# Patient Record
Sex: Male | Born: 1935 | Race: White | Hispanic: No | Smoking: Never smoker
Health system: Southern US, Community
[De-identification: ages and names within clinical notes are randomized; demographics above are authoritative.]

## PROBLEM LIST (undated history)

## (undated) DIAGNOSIS — D494 Neoplasm of unspecified behavior of bladder: Secondary | ICD-10-CM

## (undated) DIAGNOSIS — N400 Enlarged prostate without lower urinary tract symptoms: Secondary | ICD-10-CM

## (undated) DIAGNOSIS — F329 Major depressive disorder, single episode, unspecified: Secondary | ICD-10-CM

## (undated) DIAGNOSIS — F32A Depression, unspecified: Secondary | ICD-10-CM

## (undated) DIAGNOSIS — C801 Malignant (primary) neoplasm, unspecified: Secondary | ICD-10-CM

## (undated) HISTORY — PX: HERNIA REPAIR: SHX51

## (undated) HISTORY — DX: Neoplasm of unspecified behavior of bladder: D49.4

## (undated) HISTORY — DX: Malignant (primary) neoplasm, unspecified: C80.1

## (undated) HISTORY — PX: OTHER SURGICAL HISTORY: SHX169

## (undated) HISTORY — PX: COLON SURGERY: SHX602

## (undated) HISTORY — DX: Major depressive disorder, single episode, unspecified: F32.9

## (undated) HISTORY — DX: Benign prostatic hyperplasia without lower urinary tract symptoms: N40.0

## (undated) HISTORY — DX: Depression, unspecified: F32.A

---

## 2003-01-03 ENCOUNTER — Ambulatory Visit (HOSPITAL_BASED_OUTPATIENT_CLINIC_OR_DEPARTMENT_OTHER): Admission: RE | Admit: 2003-01-03 | Discharge: 2003-01-03 | Payer: Self-pay | Admitting: Urology

## 2003-01-03 ENCOUNTER — Encounter (INDEPENDENT_AMBULATORY_CARE_PROVIDER_SITE_OTHER): Payer: Self-pay

## 2003-01-03 ENCOUNTER — Ambulatory Visit (HOSPITAL_COMMUNITY): Admission: RE | Admit: 2003-01-03 | Discharge: 2003-01-03 | Payer: Self-pay | Admitting: Urology

## 2003-01-03 ENCOUNTER — Encounter: Payer: Self-pay | Admitting: Urology

## 2003-01-07 ENCOUNTER — Emergency Department (HOSPITAL_COMMUNITY): Admission: EM | Admit: 2003-01-07 | Discharge: 2003-01-07 | Payer: Self-pay | Admitting: Emergency Medicine

## 2003-01-24 ENCOUNTER — Ambulatory Visit (HOSPITAL_COMMUNITY): Admission: RE | Admit: 2003-01-24 | Discharge: 2003-01-24 | Payer: Self-pay | Admitting: Vascular Surgery

## 2003-02-23 ENCOUNTER — Inpatient Hospital Stay (HOSPITAL_COMMUNITY): Admission: AD | Admit: 2003-02-23 | Discharge: 2003-02-25 | Payer: Self-pay | Admitting: Cardiology

## 2003-03-12 ENCOUNTER — Emergency Department (HOSPITAL_COMMUNITY): Admission: EM | Admit: 2003-03-12 | Discharge: 2003-03-12 | Payer: Self-pay | Admitting: Emergency Medicine

## 2003-03-14 ENCOUNTER — Emergency Department (HOSPITAL_COMMUNITY): Admission: EM | Admit: 2003-03-14 | Discharge: 2003-03-15 | Payer: Self-pay | Admitting: Emergency Medicine

## 2003-03-17 ENCOUNTER — Ambulatory Visit (HOSPITAL_COMMUNITY): Admission: RE | Admit: 2003-03-17 | Discharge: 2003-03-17 | Payer: Self-pay | Admitting: Family Medicine

## 2003-03-18 ENCOUNTER — Inpatient Hospital Stay (HOSPITAL_COMMUNITY): Admission: EM | Admit: 2003-03-18 | Discharge: 2003-03-20 | Payer: Self-pay | Admitting: Emergency Medicine

## 2003-03-31 ENCOUNTER — Emergency Department (HOSPITAL_COMMUNITY): Admission: EM | Admit: 2003-03-31 | Discharge: 2003-03-31 | Payer: Self-pay

## 2003-04-04 ENCOUNTER — Encounter (INDEPENDENT_AMBULATORY_CARE_PROVIDER_SITE_OTHER): Payer: Self-pay | Admitting: *Deleted

## 2003-04-04 ENCOUNTER — Inpatient Hospital Stay (HOSPITAL_COMMUNITY): Admission: RE | Admit: 2003-04-04 | Discharge: 2003-04-12 | Payer: Self-pay | Admitting: *Deleted

## 2003-04-29 ENCOUNTER — Emergency Department (HOSPITAL_COMMUNITY): Admission: EM | Admit: 2003-04-29 | Discharge: 2003-04-29 | Payer: Self-pay | Admitting: Emergency Medicine

## 2003-06-16 HISTORY — PX: TRANSURETHRAL RESECTION OF PROSTATE: SHX73

## 2003-06-23 ENCOUNTER — Inpatient Hospital Stay (HOSPITAL_COMMUNITY): Admission: RE | Admit: 2003-06-23 | Discharge: 2003-06-26 | Payer: Self-pay | Admitting: Urology

## 2003-06-23 ENCOUNTER — Encounter (INDEPENDENT_AMBULATORY_CARE_PROVIDER_SITE_OTHER): Payer: Self-pay | Admitting: Specialist

## 2003-07-08 ENCOUNTER — Emergency Department (HOSPITAL_COMMUNITY): Admission: EM | Admit: 2003-07-08 | Discharge: 2003-07-08 | Payer: Self-pay | Admitting: Emergency Medicine

## 2005-11-07 ENCOUNTER — Ambulatory Visit (HOSPITAL_COMMUNITY): Admission: RE | Admit: 2005-11-07 | Discharge: 2005-11-07 | Payer: Self-pay | Admitting: Family Medicine

## 2005-11-08 ENCOUNTER — Emergency Department (HOSPITAL_COMMUNITY): Admission: EM | Admit: 2005-11-08 | Discharge: 2005-11-08 | Payer: Self-pay | Admitting: Emergency Medicine

## 2005-12-17 ENCOUNTER — Ambulatory Visit (HOSPITAL_COMMUNITY): Admission: RE | Admit: 2005-12-17 | Discharge: 2005-12-17 | Payer: Self-pay

## 2005-12-17 ENCOUNTER — Encounter (INDEPENDENT_AMBULATORY_CARE_PROVIDER_SITE_OTHER): Payer: Self-pay | Admitting: Specialist

## 2009-02-02 ENCOUNTER — Encounter: Admission: RE | Admit: 2009-02-02 | Discharge: 2009-02-02 | Payer: Self-pay | Admitting: Gastroenterology

## 2009-02-16 ENCOUNTER — Encounter: Admission: RE | Admit: 2009-02-16 | Discharge: 2009-02-16 | Payer: Self-pay | Admitting: Family Medicine

## 2009-03-13 ENCOUNTER — Encounter (INDEPENDENT_AMBULATORY_CARE_PROVIDER_SITE_OTHER): Payer: Self-pay | Admitting: General Surgery

## 2009-03-13 ENCOUNTER — Inpatient Hospital Stay (HOSPITAL_COMMUNITY): Admission: RE | Admit: 2009-03-13 | Discharge: 2009-03-19 | Payer: Self-pay | Admitting: General Surgery

## 2009-03-29 ENCOUNTER — Ambulatory Visit: Payer: Self-pay | Admitting: Oncology

## 2009-04-09 LAB — CBC WITH DIFFERENTIAL/PLATELET
BASO%: 0.4 % (ref 0.0–2.0)
EOS%: 2.6 % (ref 0.0–7.0)
LYMPH%: 43.6 % (ref 14.0–49.0)
MCH: 27.4 pg (ref 27.2–33.4)
MCHC: 32.2 g/dL (ref 32.0–36.0)
MONO#: 0.8 10*3/uL (ref 0.1–0.9)
MONO%: 9.8 % (ref 0.0–14.0)
NEUT%: 43.6 % (ref 39.0–75.0)
Platelets: 142 10*3/uL (ref 140–400)
RBC: 4.63 10*6/uL (ref 4.20–5.82)
WBC: 7.8 10*3/uL (ref 4.0–10.3)
nRBC: 0 % (ref 0–0)

## 2009-04-09 LAB — COMPREHENSIVE METABOLIC PANEL
ALT: 10 U/L (ref 0–53)
AST: 20 U/L (ref 0–37)
Alkaline Phosphatase: 69 U/L (ref 39–117)
BUN: 12 mg/dL (ref 6–23)
Creatinine, Ser: 1.35 mg/dL (ref 0.40–1.50)
Total Bilirubin: 0.7 mg/dL (ref 0.3–1.2)

## 2009-05-24 ENCOUNTER — Encounter: Admission: RE | Admit: 2009-05-24 | Discharge: 2009-05-24 | Payer: Self-pay | Admitting: General Surgery

## 2010-01-29 ENCOUNTER — Encounter: Admission: RE | Admit: 2010-01-29 | Discharge: 2010-01-29 | Payer: Self-pay | Admitting: Gastroenterology

## 2010-04-07 ENCOUNTER — Encounter: Payer: Self-pay | Admitting: Gastroenterology

## 2010-06-17 LAB — URINALYSIS, ROUTINE W REFLEX MICROSCOPIC
Glucose, UA: NEGATIVE mg/dL
Nitrite: NEGATIVE
Specific Gravity, Urine: 1.014 (ref 1.005–1.030)
pH: 7 (ref 5.0–8.0)

## 2010-06-17 LAB — DIFFERENTIAL
Basophils Absolute: 0 10*3/uL (ref 0.0–0.1)
Basophils Relative: 0 % (ref 0–1)
Eosinophils Relative: 2 % (ref 0–5)
Lymphocytes Relative: 29 % (ref 12–46)
Monocytes Absolute: 0.9 10*3/uL (ref 0.1–1.0)
Monocytes Relative: 12 % (ref 3–12)
Neutro Abs: 4.5 10*3/uL (ref 1.7–7.7)

## 2010-06-17 LAB — CBC
HCT: 37.6 % — ABNORMAL LOW (ref 39.0–52.0)
HCT: 39.1 % (ref 39.0–52.0)
Hemoglobin: 12.3 g/dL — ABNORMAL LOW (ref 13.0–17.0)
Hemoglobin: 12.9 g/dL — ABNORMAL LOW (ref 13.0–17.0)
MCHC: 32.9 g/dL (ref 30.0–36.0)
MCV: 85.5 fL (ref 78.0–100.0)
RBC: 4.4 MIL/uL (ref 4.22–5.81)
RBC: 4.53 MIL/uL (ref 4.22–5.81)
RDW: 14.2 % (ref 11.5–15.5)
WBC: 10.2 10*3/uL (ref 4.0–10.5)

## 2010-06-17 LAB — BASIC METABOLIC PANEL
CO2: 25 mEq/L (ref 19–32)
Calcium: 9.2 mg/dL (ref 8.4–10.5)
Chloride: 107 mEq/L (ref 96–112)
GFR calc Af Amer: >60 mL/min (ref >60)
GFR calc non Af Amer: >60 mL/min (ref >60)
Glucose, Bld: 93 mg/dL (ref 70–99)
Potassium: 4.6 mEq/L (ref 3.5–5.1)
Potassium: 4.8 mEq/L (ref 3.5–5.1)
Sodium: 136 mEq/L (ref 135–145)
Sodium: 137 mEq/L (ref 135–145)

## 2010-06-17 LAB — APTT: aPTT: 30 seconds (ref 24–37)

## 2010-07-06 IMAGING — CR DG CHEST 2V
2 series · 2 of 2 positions shown · non-contrast
Comparison: Chest x-ray of 12/15/2005

CLINICAL DATA: Cough, preop for colon lesion

CHEST - 2 VIEW

[view not recorded (1 of 2)]
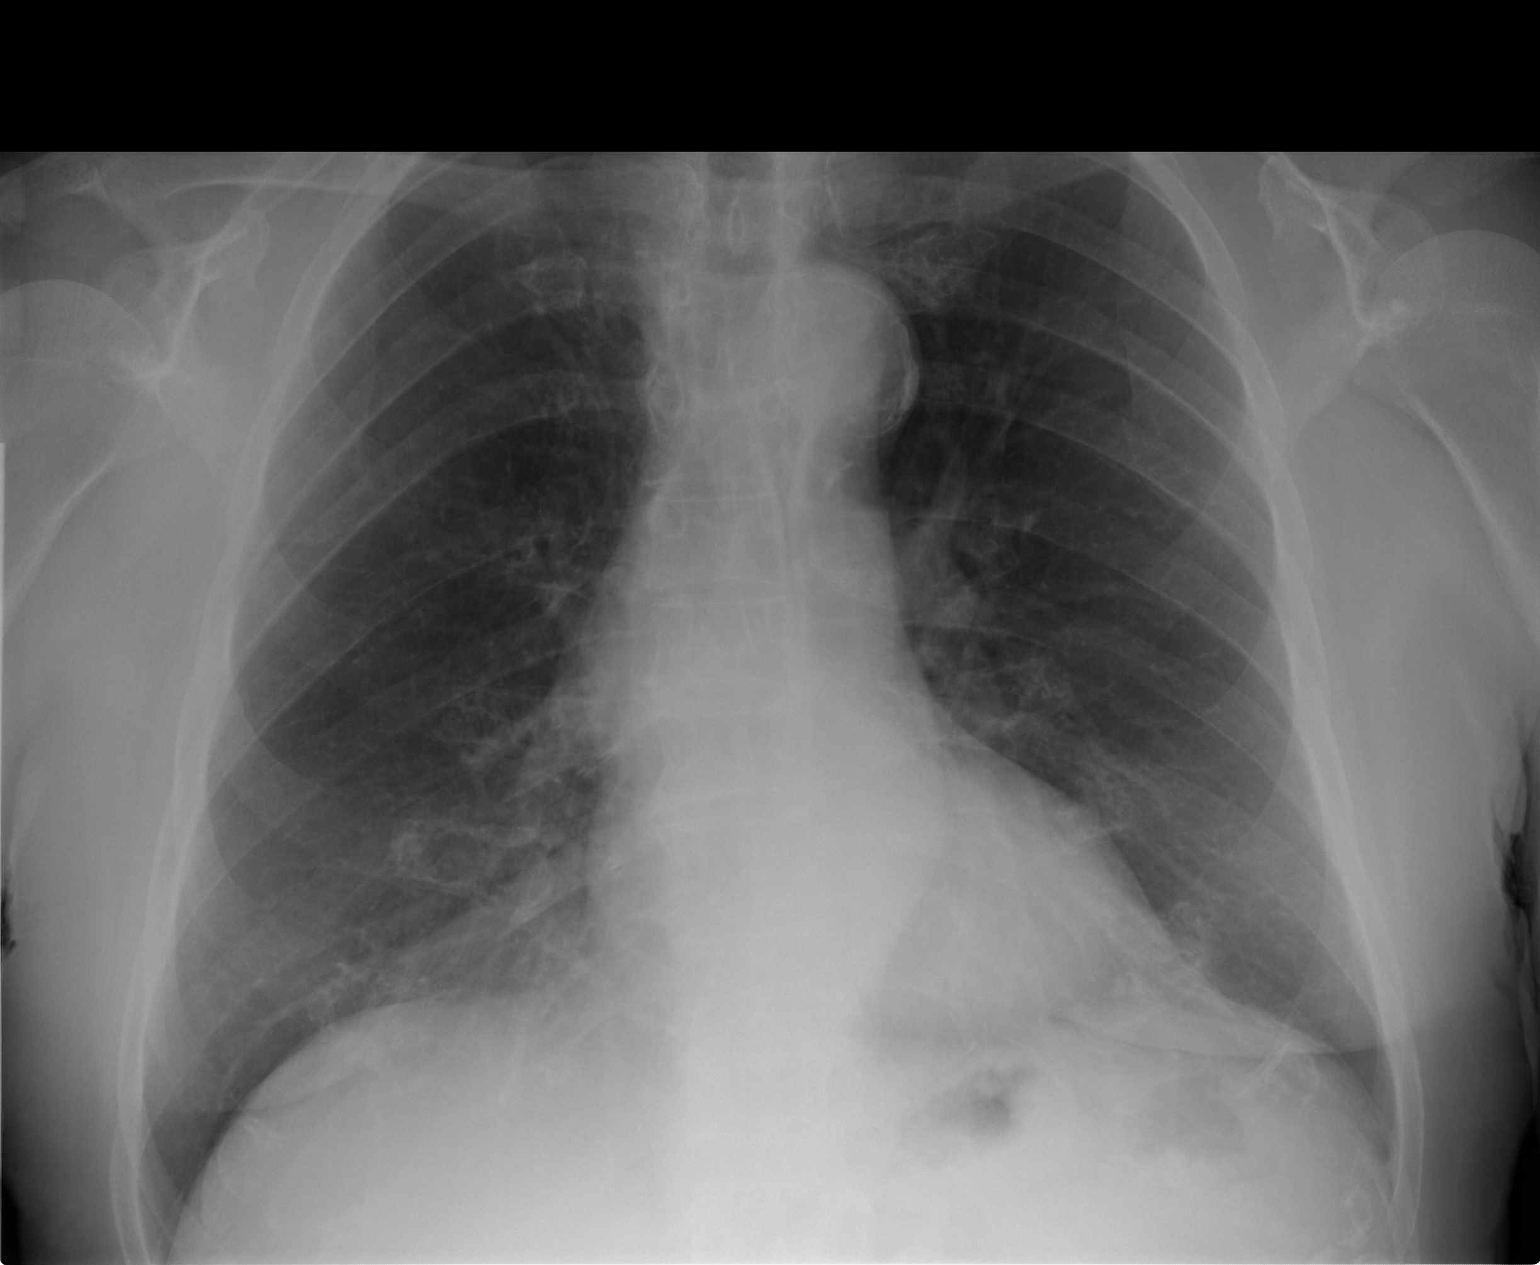

[view not recorded (2 of 2)]
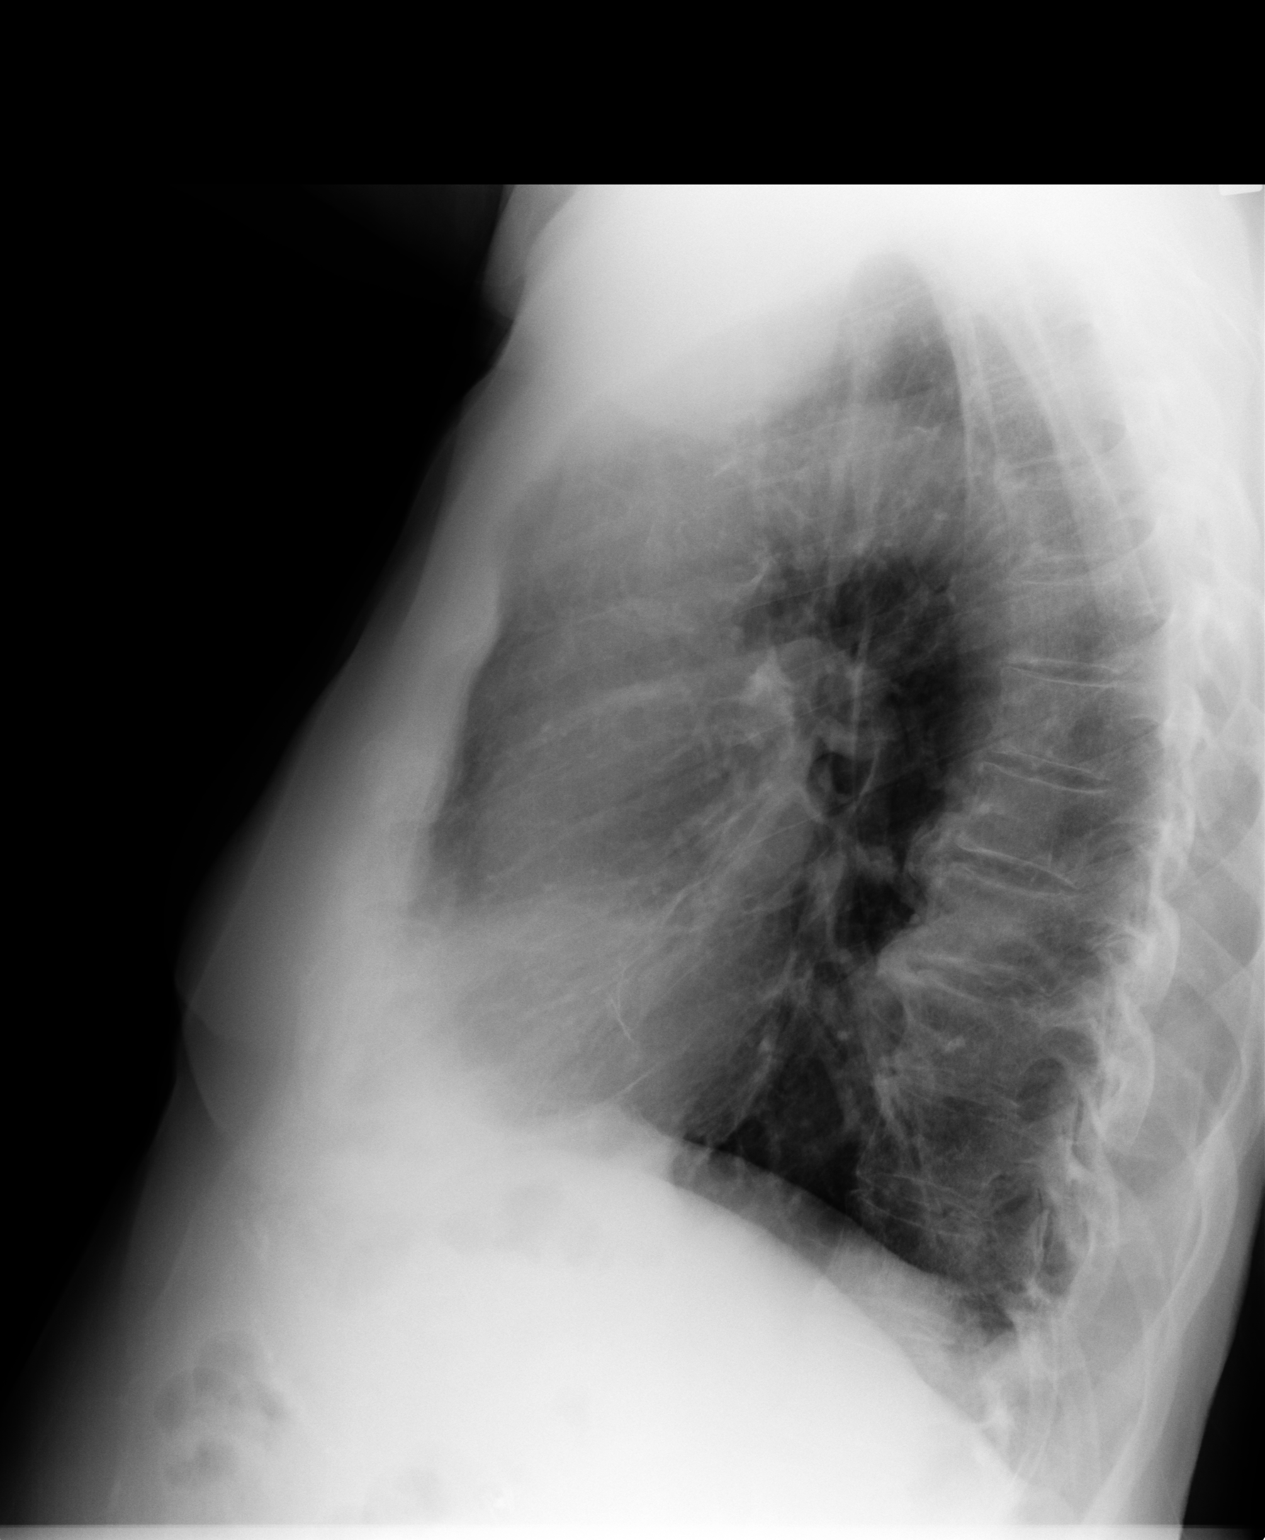

[2 of 2 positions shown; findings below may reference images not displayed]

FINDINGS: The lungs are clear and slightly hyperaerated.  The heart
is within normal limits in size.  Mediastinal contours are stable.
There are degenerative changes in the lower thoracic spine.
IMPRESSION: Stable chest x-ray.  No active lung disease.

## 2010-08-02 NOTE — H&P (Signed)
NAME:  EDWAR, COE NO.:  [id number]   MEDICAL RECORD NO.:  [id number]                   PATIENT TYPE:  EMS   LOCATION:  ED                                   FACILITY:  [facility]   PHYSICIAN:  Balinda Quails, M.D.                 DATE OF BIRTH:  04-11-1916   DATE OF ADMISSION:  03/31/2003  DATE OF DISCHARGE:                                HISTORY & PHYSICAL   CHIEF COMPLAINT:  Bilateral iliac aneurysms.   HISTORY OF PRESENT ILLNESS:  Ricky Nguyen is a very-pleasant 77 year old  gentleman with a history of bladder tumor who recently underwent a  transurethral resection of his bladder tumor and was found to have bilateral  iliac aneurysms on the preoperative CT scan.  A CT scan from December 19, 2002, showed a 4.5 cm left internal iliac artery aneurysm and bilateral  common iliac artery aneurysms.  The patient was seen in the clinic by Dr.  Madilyn Fireman who felt that the patient needed further evaluation with an  arteriogram for better assessment and decision making.  The patient  underwent an arteriogram on January 24, 2003, which revealed bilateral  internal iliac artery aneurysm, bilateral patent renal arteries and patent  celiac and SMA without flow-limiting stenosis.  The patient also underwent  cardiac catheterization for postoperative screening.  This was completed on  February 23, 2003, and revealed widely patent coronary arteries.  The patient  had a normal ejection fraction of 64%.  The patient then was cleared by Dr.  Amil Amen for vascular surgery.   The plan is for the patient to meet with Dr. Madilyn Fireman in the CVTS office on  Monday April 03, 2003, for discussion of the details of surgery.  The  patient has already been explained the risks, benefits and options of the  surgery by Dr. Madilyn Fireman in a prior office visit.  The plan is for the patient  to undergo surgery on April 04, 2003, for bilateral iliac aneurysm repair.  The patient presents today for  preoperative history and physical.   Currently, the patient's main complaint is that of problems with his  indwelling Foley catheter.  The patient has had this since his surgery for  resection of the bladder tumor secondary to incontinence and  urinary  retention.  Apparently, the patient was seen in Dr. Madilyn Hook office yesterday  at which time he decided to remove the Foley catheter and to see how the  patient fared without it.  Unfortunately, the patient developed urinary  retention throughout the evening and into the night, and presented to the  emergency room last night for evaluation.  A Foley catheter was reinserted  and yielded exorbitant amounts of urine which were blood tinged.  The  patient has continued to have mild bloody discharge from the urethral  opening as well as blood tinged urine collecting in the leg bag.  The  patient states that the pain is much less at this point after taking several  Percocet.  Otherwise, Ricky Nguyen states that he is feeling quite remarkably  well.  Specifically, the patient denies any cough, fever, chills, night  sweats, nausea, vomiting, diarrhea or constipation.  He denies any shortness  of breath, dyspnea on exertion palpitations, chest pain.   PAST MEDICAL HISTORY:  1. Bladder cancer status post transurethral resection of bladder tumor on     January 03, 2003.  2. Incarcerated inguinal hernia repair on the right on March 18, 2003.  3. Chronic Foley catheter secondary to urinary retention and incontinence.     (This has been in place since October of 2004).  4. Status post left brachial sheath hematoma evacuation by Dr. Arbie Cookey on     February 25, 2003.  5. The patient specifically denies any diabetes, hypertension, history of     myocardial infarction, angina, history of stroke or CVA.   CURRENT MEDICATIONS:  1. Prednisone 10 mg daily (last dose on Sunday, April 02, 2003).  2. Ativan 0.5 to 1 mg t.i.d. as needed.  3. Ambien 5-10 mg p.o.  q.h.s. for sleep.  4. Percocet one to two tablets q.6h. as needed for pain.  5. Pyridium as needed for bladder spasms.  6. Docusate sodium, 100 mg p.o. b.i.d.  7. Milk of Magnesia two tablespoons daily.   ALLERGIES:  No known drug allergies.  The patient does have a sensitivity to  morphine which causes severe headache.   SOCIAL HISTORY:  The patient is retired and is married with five children.  He denies any tobacco use although he did smoke cigars of which he quit 21  years ago.  The patient denies any alcohol use recently.  He does admit to a  one glass of wine per day alcohol intake prior to discovery of his bladder  cancer.   FAMILY HISTORY:  The patient specifically denies any family history of heart  disease, diabetes or other cancer.   PERTINENT REVIEW OF SYSTEMS:  Please see HPI for pertinent positives and  negatives.  Otherwise, 12-point review of systems is negative besides the  following:  1. The patient admits to arthritis.  2. The patient admits to occasional headaches which are relieved by over-the-     counter medications.  3. The patient wears glasses.  4. The patient has a history of recurrent sinusitis.   PHYSICAL EXAMINATION:  VITAL SIGNS:  Blood pressure 120/80, pulse 75 and  regular, respirations are 16 and unlabored.  Weight 166 pounds.  Height is 5  feet, 7 inches.  GENERAL:  This is a well-appearing, well-nourished, pleasant 77 year old  male who is in no acute distress.  He answers questions appropriately.  HEENT:  EOMI, conjunctivae clear.  Sclerae is anicteric.  Hearing is within  normal limits.  Nasopharynx is pink and moist without exudate.  The patient  has all of his native teeth and no oral sores.  NECK:  Supple, no lymphadenopathy or thyromegaly.  CHEST:  Lungs are clear to auscultation throughout without wheezes, crackles  or rales.  BACK:  The patient has no CVA or spinal tenderness. HEART/VASCULAR:  The patient's heart is a regular rate and  rhythm without  murmur, gallop or rub.  He has no evidence of carotid bruits.  The patient  has 2+ carotid pulses bilaterally, 2+ radial pulses bilaterally, 2+ femoral  pulses bilaterally, 1+ posterior tibial pulses bilaterally, and nonpalpable  dorsalis pedis pulses.  The  patient has very minimal venous insufficiency  changes in the lower extremities.  She has no peripheral edema and is warm  and well perfused.  ABDOMEN:  Soft, nontender, nondistended with good bowel sounds.  No  organomegaly or masses.  GU:  The patient does have a leg urine bag and  catheter placed in the ureteral opening.  There is blood-tinged urine  collecting in the leg bag.  SKELETAL/ NEUROMUSCULAR:  Appropriate and equal strength throughout.  NEUROLOGIC:  Grossly intact.  Sensation is intact.   IMPRESSION/ PLAN:  1. Ricky Nguyen is an otherwise healthy 77 year old gentleman who has an     incidental finding of bilateral iliac aneurysms.  The patient has been     seen by Dr. Madilyn Fireman to discuss the risks, benefits and complications of     surgery.  The patient has discussed this with his family and agrees to     proceed with surgery as planned.  The patient will undergo surgical     repair of his bilateral iliac aneurysms on April 04, 2003, by Dr.     Madilyn Fireman.  The patient is to see Dr. Madilyn Fireman once more in the office on     April 03, 2003, at which time he will answer any further questions or     concerns that he may have regarding surgery.  At that time, Dr. Madilyn Fireman     will discuss the risks, benefits and complications of the procedure once     again.  2. Otherwise, we will continue as planned with bilateral iliac aneurysm     repair on April 04, 2003.      Carolyn A. Arlean Hopping, M.D.    CAF/MEDQ  D:  03/31/2003  T:  03/31/2003  Job:  [id number]   cc:   Everardo All. Madilyn Fireman, M.D.  [address]  [place]  [place] [address]  Fax: [phone]   Lindaann Slough, M.D.  [address]  [place]  [place] [address]  Fax: [phone]   Francisca December, M.D.  [address]  [address]  [place]  [place] [address]  Fax: [phone]   P. Liliane Bade, M.D.  [address]  [place]  [place] [address]

## 2010-08-02 NOTE — Op Note (Signed)
NAME:  Ricky Nguyen, Ricky Nguyen                       ACCOUNT NO.:  [id number]   MEDICAL RECORD NO.:  [id number]                   PATIENT TYPE:  INP   LOCATION:  2310                                 FACILITY:  [facility]   PHYSICIAN:  Balinda Quails, M.D.                 DATE OF BIRTH:  Apr 15, 1916   DATE OF PROCEDURE:  04/04/2003  DATE OF DISCHARGE:                                 OPERATIVE REPORT   PREOPERATIVE DIAGNOSES:  1. Bilateral common iliac artery aneurysms.  2. Bilateral hypogastric artery aneurysms.   POSTOPERATIVE DIAGNOSES:  1. Bilateral common iliac artery aneurysms.  2. Bilateral hypogastric artery aneurysms.   OPERATION PERFORMED:  1. Repair of bilateral common iliac aneurysms and hypogastric aneurysms with     aortobi-iliac graft.  2. Reimplantation of inferior mesenteric artery.   SURGEON:  Balinda Quails, M.D.   ASSISTANT:  Coral Ceo, P.A.   ANESTHESIA:  General endotracheal.   ANESTHESIOLOGIST:  Quita Skye. Krista Blue, M.D.   INDICATIONS FOR PROCEDURE:  Ricky Nguyen is an 77 year old gentleman with a  history of bladder cancer.  During work-up of bladder cancer, he was found  to have a large common iliac and hypogastric artery aneurysms bilaterally.  He recently presented with an incarcerated hernia that required emergency  surgery.  This delayed his elective procedure for aneurysm repair.   He has undergone cardiac catheterization verifying adequate coronary  circulation.  He underwent preoperative arteriography verifying also large  common iliac and hypogastric artery aneurysms bilaterally.  A meeting was  set up yesterday in the office with the patient, his wife and daughter.  All  questions were answered.  The risks of the operative procedure were  discussed in detail.  The major morbidity and mortality of approximately 5%.  The patient was brought to the operating room at this time for elective  repair.   DESCRIPTION OF PROCEDURE:  The patient was brought to the  operating room in  stable condition.  Placed in supine position.  General endotracheal  anesthesia induced.  A Foley catheter in place.  The abdomen and both legs  prepped and draped in the sterile fashion.  A midline skin incision made  from xiphoid to pubis.  Incision extended deeply through the subcutaneous  tissue.  Dissection carried down to the linea alba.  Fascia incised.  The  peritoneal cavity entered without difficulty.  Full laparotomy evaluation  carried out.  Gallbladder and liver were normal.  Pancreas unremarkable.  Stomach and duodenum normal.  Small bowel revealed no masses.  Large bowel  revealed no masses.  There were some adhesions of the sigmoid colon to the  dome of the bladder.  These were taken down.  There was some fat necrosis.  There were some nodules on the dome of the bladder.  A biopsy of the nodule  was taken and sent for frozen section which revealed fat necrosis.  The  retroperitoneum  revealed a large hypogastric and common iliac arteries  bilaterally which were aneurysmal.  The infrarenal aorta was cleared.  The  small bowel retracted to the right.  The transverse colon was brought  superiorly.  Retroperitoneum incised proximally up to the origin of the  renal arteries which were freed and clearly delineated.  Dissection carried  down to expose the inferior mesenteric artery which was freed and encircled  with a vessel loop.  Distal dissection then carried down into pelvis.  Retroperitoneum incised over the right common iliac artery which was  aneurysmal.  The right external iliac artery origin was freed and encircled  with a vessel loop.  This revealed some ectasia but no aneurysmal change.  Aneurysmal change in the  right hypogastric artery was present and this  dissection was carried along the aneurysm deep down into the pelvis and the  branches of the hypogastric artery were encircled with umbilical tapes deep  in the pelvis.  Attention was then placed  on the left hypogastric artery.  The ureter reflected medially.  The sigmoid colon mobilized and reflected  medially.  The retroperitoneum entered lateral to the sigmoid colon.  The  left hypogastric aneurysm was very large and followed deeply in the pelvis.  Tributaries of the artery, however, could not be controlled due to the size  of the aneurysm.  The external iliac artery was encircled with a vessel  loop.  The left common iliac artery was also aneurysmal.  The patient was  administered 7000 units of heparin intravenously, 25 mg of mannitol  intravenously.  The infrarenal aorta controlled with an aortic DeBakey  clamp.  The left common iliac artery was ligated with umbilical tape.  The  infrarenal aorta was controlled with an aortic DeBakey clamp just proximal  to the bifurcation.  The infrarenal aorta opened longitudinally with  electrocautery.  Back-bleeding lumbar vessel controlled with figure-of-eight  2-0 silk suture.  The aorta divided transversely across the infrarenal  portion. A 16 x 8 Hemashield graft was anastomosed end-to-end to the  infrarenal aorta using a running 4-0 Prolene suture.  At completion of the  proximal anastomosis the graft was flushed and controlled with a Fogarty  clamp.  Each limb of the graft was then brought down into the respective  pelvis.  On the right side, the external iliac artery was controlled with a  Henley clamp.  The hypogastric artery was opened and the common iliac artery  were opened longitudinally with electrocautery.  A large amount of laminated  thrombus removed.  There was remaining back-bleeding vessel deep in the  pelvis on the right and this was controlled with a clip.  The right limb of  the graft was then brought over to the right external iliac artery which was  divided transversely.  The right limb of the graft then anastomosed end-to-  end to the right external iliac artery using running 4-0 Prolene suture.  At completion of  this, clamps were removed.  Right leg reperfused without  difficulty.  The left limb of the graft was then tunneled behind artery to  the sigmoid mesocolon.  The left external iliac artery controlled with a  Henley clamp.  The left hypogastric aneurysm was opened longitudinally and a  large amount of laminated thrombus removed.  Bleeding from deep in the  pelvis was controlled within the aneurysm sac with a running 2-0 Prolene  suture.  The left external iliac artery was then divided transversely and  the left  limb of the graft anastomosed end-to-end to the left external iliac  artery using running 4-0 Prolene suture.  At completion of this, flushing  carried out, clamps removed and the left leg reperfused.  Attention was then  placed on the inferior mesenteric artery.  The IMA was divided off of the  aorta.  Partial occlusion clamp placed on the aortic graft.  The aortic  graft opened with an 11 blade, 4.4 punch.  The inferior mesenteric artery  was anastomosed end-to-side to the graft with running 6-0 Prolene suture.  At completion of the anastomosis the partial occlusion clamp removed and the  inferior mesenteric artery was patent with brisk flow.  Patient administered  50 mg of protamine intravenously.  Pelvis was examined to ensure there was  no further backbleeding from the hypogastric branches.  Both hypogastric  aneurysms were packed with Gelfoam and closed with running 3-0 Vicryl  suture.  The retroperitoneum was then reapproximated over the graft with  running 3-0 Vicryl suture.  The abdomen was examined to ensure there were no  retained instruments or sponges.  Adequate hemostasis obtained.  Sponge and  instrument counts were correct.  The midline fascia was then closed with  running #1 PDS suture.  The  skin closed with staples.  Sterile dressings applied.  The patient remained  hemodynamically stable throughout the operative procedure with adequate  urine output.  Transferred  directly to surgical intensive care unit in  stable condition.                                               Balinda Quails, M.D.    PGH/MEDQ  D:  04/04/2003  T:  04/05/2003  Job:  [id number]   cc:   Lindaann Slough, M.D.  [address]  [place]  [place] [address]  Fax: [phone]   Francisca December, M.D.  [address]  [place]  [place] [address]  Fax: [phone]

## 2010-08-02 NOTE — Op Note (Signed)
NAME:  Ricky Nguyen, Ricky Nguyen NO.:  [id number]   MEDICAL RECORD NO.:  [id number]                   PATIENT TYPE:  INP   LOCATION:  3704                                 FACILITY:  [facility]   PHYSICIAN:  Larina Earthly, M.D.                 DATE OF BIRTH:  [birth year]   DATE OF PROCEDURE:  02/24/2003  DATE OF DISCHARGE:  02/25/2003                                 OPERATIVE REPORT   PREOPERATIVE DIAGNOSIS:  Left brachial sheath hematoma with motor and  sensory impairment following left brachial artery catheterization.   POSTOPERATIVE DIAGNOSIS:  Left brachial sheath hematoma with motor and  sensory impairment following left brachial artery catheterization.   PROCEDURE:  Evacuation of left brachial hematoma and closure of arterial  puncture site, left brachial artery.   SURGEON:  Larina Earthly, M.D.   ASSISTANT:  Rowe Clack, P.A.-C.   ANESTHESIA:  General endotracheal.   COMPLICATIONS:  None.   DISPOSITION:  To recovery room stable.   DESCRIPTION OF PROCEDURE:  The patient was taken to the operating room and  placed in the supine position where the area of the left arm was prepped and  draped in the usual sterile fashion.  The patient had undergone a prior  cardiac catheterization via the left brachial approach 24 hours prior to  this procedure.  Incision was made over the brachial artery longitudinally  above the elbow and carried down to isolate the hematoma which was evacuated  from the brachial artery sheath area.  The artery itself was mobilized as  were the remaining portions of the neurovascular bundle.  There was no  active bleeding, but on further mobilization, there was some bleeding from  the puncture site and this was controlled with a 6-0 Prolene suture.  All  remaining hematoma was evacuated and the wound was irrigated with saline.  Hemostasis with electrocautery.  The wound was closed with 3-0 Vicryl in the  subcutaneous and subcuticular  tissue.  Benzoin and Steri-Strips were  applied.                                               Larina Earthly, M.D.    TFE/MEDQ  D:  02/24/2003  T:  02/25/2003  Job:  [id number]   cc:   Francisca December, M.D.  [address]  [place]  [place] [address]  Fax: [phone]

## 2010-08-02 NOTE — Op Note (Signed)
NAMECORLISS, LAMARTINA             ACCOUNT NO.:  [id number]   MEDICAL RECORD NO.:  [id number]          PATIENT TYPE:  OIB   LOCATION:  1331                         FACILITY:  [facility]   PHYSICIAN:  Lebron Conners, M.D.   DATE OF BIRTH:  July 06, 1916   DATE OF PROCEDURE:  12/17/2005  DATE OF DISCHARGE:  12/17/2005                                 OPERATIVE REPORT   PREOPERATIVE DIAGNOSIS:  Symptomatic gallstones.   POSTOPERATIVE DIAGNOSIS:  Symptomatic gallstones.   OPERATION:  Laparoscopic cholecystectomy.   SURGEON:  Lebron Conners, M.D.   ANESTHESIA:  General and local.   BLOOD LOSS:  Minimal.   SPECIMEN:  Gallbladder.   COMPLICATIONS:  None.   PROCEDURE:  After the patient was monitored and asleep and had routine  preparation and draping of the abdomen.  I anesthetized the area just below  the umbilicus with long-acting local anesthetic and made about a 3 cm  transverse incision.  I dissected down through the fat and scar to the  midline fascia and incised it for about 2 cm longitudinally.  I then  carefully dissected through the preperitoneal space which was quite thick  and entered the peritoneal cavity, very carefully assuring that I voided  visceral injury since the patient had a midline abdominal incision.  I did  achieve free access to the abdominal cavity and placed a 0 Vicryl  pursestring suture in the fascia and secured a Hassan cannula and inflated  the abdomen with carbon dioxide.  I then viewed the abdominal contents and I  saw adhesions in the midline mostly obscuring examination of the left side  of the abdomen but I had a good view of the right upper quadrant and liver.  I placed three additional ports through locally anesthetized sites under  direct view assuring no visceral injury upon insertion of the ports.  With  the patient positioned head-up, foot down and rotated to the left, I grasped  the fundus of the gallbladder and retracted it toward the right  shoulder.  The infundibulum came up nicely and there were a few adhesions of omentum  and duodenum to it which I carefully took down bluntly.  I then pulled the  infundibulum to the right and dissected the hepatoduodenal ligament area  until I clearly identified the cystic duct emerging from the infundibulum of  the gallbladder and traveling inferiorly and medially and clearly identified  the cystic artery crossing the triangle of Calot.  I clipped and divided the  cystic artery.  I dissected further until I had a very wide window all the  way through and then I clipped the cystic duct as it emerged from the  infundibulum.  Just below that I found an  area which was thickened and I  made a small cut on it, found it to be a tiny gallstone impacted in the  distal cystic duct.  I found no other sign of any stones.  I placed three  clips distal to that and divided the cystic duct between the two closest to  the gallbladder.  I did not feel a cholangiogram was  necessary since the  patient had no symptoms or lab tests suggesting common duct stones.  I then  dissected the gallbladder from the liver using the cautery and blunt  dissection and gaining hemostasis with the cautery.  After detaching it from  the liver, I removed it through the umbilical incision and tied the  pursestring suture.  I examined the operative area and found excellent  hemostasis after irrigating and removing the irrigant.  I saw that all the  clips were securely in place.  I removed the two lateral ports under direct  view, then allowed the carbon  dioxide to escape through the epigastric port and removed it as well.  I  closed all the skin incisions with intracuticular 4-0 Vicryl and Steri-  Strips.  The patient tolerated the operation quite well and went to PACU in  good condition.      Lebron Conners, M.D.  Electronically Signed     WB/MEDQ  D:  12/17/2005  T:  12/18/2005  Job:  [id number]   cc:   Melida Quitter, M.D.  Fax: [phone]

## 2010-08-02 NOTE — Op Note (Signed)
NAME:  CANNON, ARREOLA                         ACCOUNT NO.:  [id number]   MEDICAL RECORD NO.:  [id number]                   PATIENT TYPE:  AMB   LOCATION:  [facility]                                 FACILITY:  [facility]   PHYSICIAN:  Lindaann Slough, M.D.               DATE OF BIRTH:  [birth year]   DATE OF PROCEDURE:  01/03/2003  DATE OF DISCHARGE:                                 OPERATIVE REPORT   PREOPERATIVE DIAGNOSIS:  Bladder tumor.   POSTOPERATIVE DIAGNOSIS:  Bladder tumor.   PROCEDURE:  Cystoscopy, transurethral resection of bladder tumor, large.   SURGEON:  Lindaann Slough, M.D.   ASSISTANT:  Susanne Borders, MD   ANESTHESIA:  General endotracheal.   DRAINS:  108 French Foley catheter to straight drain.   SPECIMENS:  Bladder tumor to pathology, right and left random bladder  biopsies to pathology.   ESTIMATED BLOOD LOSS:  100 mL   COMPLICATIONS:  None.   DISPOSITION:  To post anesthesia care unit in stable condition.   INDICATIONS FOR PROCEDURE:  Mr. Recendez is an 77 year old male whose  recently been evaluated by Dr. Brunilda Payor for gross hematuria. The patient's  hematuria workup consisted of a CT scan with contrast which cleared his  kidneys and upper tracts. The patient underwent flexible cystoscopy in the  office which demonstrated a large bladder tumor on the left lateral wall and  left base. Dr. 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 The patient  consented to this after understanding risks, benefits, and alternatives.   DESCRIPTION OF PROCEDURE:  The patient was brought to the operating room and  correctly identified by is identification bracelet. He was given  preoperative antibiotics and general endotracheal anesthesia. His genitalia  was prepped and draped in typical sterile fashion and he was placed in the  dorsal  lithotomy position. Cystoscopy was performed with a 22 French  cystoscope sheath with 12 and 70 degree lens. The anterior urethra was  within normal limits. The posterior urethra demonstrated trilobar  hypertrophy with a moderately sized median lobe and lateral lobes that  coapted in the midline somewhat obstructing the bladder outlet. The  prostatic urothelium was markedly friable and considerable bleeding was  started just from passing the cystoscope. Immediately upon entering the  bladder, a large left sided bladder tumor was noted. The tumor extended from  about the level of the ureteral orifice medially and went up the left  lateral wall. The left ureteral orifice could be identified. There was tumor  close to it laterally but it was not completely involved with tumor. The  tumor was frondular in appearance consistent with the appearance of low  grade transitional cell carcinoma. The right ureteral orifice was identified  in its normal anatomic location and was effluxing clear urine. The patient's  bladder was moderately trabeculated consistent with long standing bladder  outlet obstruction. The rest of the urothelium was carefully surveyed for  tumors and none were found. The cystoscope was removed and the patient's  urethra was sequentially dilated with Sissy Hoff sounds up to 30 [place]. A 26  French resectoscope sheath with obturator was easily passed into the  bladder. The resectoscope with 12 degree lens was used. Initially the  lesions on the left side of the bladder wall were biopsied using the cold  loop. This removed a considerable amount of the frondular tumors revealing  the tumor bases below. The cutting current was then used to remove the bases  of the tumor, taking muscular bladder wall with it for adequate staging.  Extreme care was taken not to make the resection site too thin. The  resection was begun laterally at the anterior most aspect of the tumor and  was carried  medially towards the ureteral orifice. Around the ureteral  orifice, the tumor was carefully coagulated and not resected. The tumor  appeared very superficial in this area. Again, the tumor did not involve the  ureteral orifice and ureteral orifice was spared from any resection. After  the tumor was adequately resected, the entire tumor bed was thoroughly  coagulated. There is no further bleeding from the tumor bed and the ureteral  orifice again, was intact. Cold cup biopsy  forceps were then used to take  three random bladder biopsies. The resectoscope was replaced and these sites  were carefully fulgurated. At the end of the case, there was still some  oozing, mainly from the prostatic urothelium. The resection and biopsy sites  did not appear to be bleeding with flow turned to the off position. The  patient's bladder was then drained and an 36 French Foley catheter was  placed. 40 mL of 40 mg solution of mitomycin-C was instilled into the  patient's bladder to be left for 2 hours. The patient will be discharged  home with his Foley catheter and return in 3 days for a voiding trial in the  office with Dr. Brunilda Payor.  The patient will be discharged to home with pain  medication, antibiotics and Urised.     Susanne Borders, MD                           Lindaann Slough, M.D.    DR/MEDQ  D:  01/03/2003  T:  01/03/2003  Job:  [id number]

## 2010-08-02 NOTE — Op Note (Signed)
NAME:  Ricky Nguyen, ROGUS NO.:  [id number]   MEDICAL RECORD NO.:  [id number]                   PATIENT TYPE:  INP   LOCATION:  0101                                 FACILITY:  [facility]   PHYSICIAN:  Lorre Munroe., M.D.            DATE OF BIRTH:  04-Apr-1916   DATE OF PROCEDURE:  03/18/2003  DATE OF DISCHARGE:                                 OPERATIVE REPORT   PREOPERATIVE DIAGNOSIS:  Incarcerated right inguinal hernia.   POSTOPERATIVE DIAGNOSES:  Incarcerated right inguinal hernia.   OPERATION:  Repair of right inguinal hernia.   SURGEON:  Lebron Conners, M.D.   ANESTHESIA:  General.   CLINICAL PRESENTATION:  The patient is an 77 year old man who has had  transurethral resection of bladder tumor, and who is known to have bilateral  iliac aneurysms, and has recent sciatica treated with narcotics.  He was  straining to have a bowel movement because of constipation, and developed  acute pain in the right groin and then generalized across the lower abdomen.  He was found to have a tender, nonreducible right inguinal mass.  On  exploration presence of incarcerated hernia was confirmed.   DESCRIPTION OF PROCEDURE:  The patient was monitored and anesthetized, and  had routine preparation and draping of the right inguinal region and the  midline of the abdomen.  I then made an oblique incision 7 cm in length,  beginning just at the pubic tubercle, and dissected down through the  subcutaneous tissues exposing the external oblique and the hernia presenting  through the superficial ring.  I opened the external oblique in the  direction of its fibers laterally.  I then dissected the spermatic cord free  and circled it with a Penrose drain.  The hernia separated readily from the  spermatic cord.  I cut through the hernia sac and noted some serosanguineous  fluid, without any purulence.  A very dusky loop of small intestine was  present in the hernia.  I then  enlarged the hernia defect slightly, so that  I could pull more bowel out.  I thoroughly examined the bowel, and found  that there was no evidence of perforation; although the bowel was quite  dusky.  I put a Babcock clamp on it and reduced it into the abdomen.  I then  further defined the anatomy and felt that was probably a laterally placed  direct hernia.  After waiting for a few minutes, I pulled the bowel back out  and found that the duskiness had almost completely resolved.  Thorough  examination of the bowel disclosed no area of compromise -- either by the  pressure of the edges of the hernia or lack of circulation while it was  incarcerated.  I then reduced it within the abdomen and closed the defect in  the hernia sac with running pursestring-type 2-0 silk stitch.  I did reduce  the hernia sac through the defect,  held it in place with a plug of  polypropylene mesh held in place with a 2-0 silk stitch.  I then fashioned a  patch of polypropylene mesh cut to fit the inguinal floor, and with a slit  cut in it to allow exit of the spermatic cord.  I sewed that in with 2-0  Prolene, beginning at the pubic tubercle and sewing medially and superiorly  with a running basting suture laterally; then inferiorly with a running  suture incorporating the inguinal ligament.  I then used a single stitch to  join the tails of the mesh together, lateral to the cord.  The hernia repair  appeared secure.  I thoroughly anesthetized the deep and superficial tissues  with 0.5% Bupivacaine with epinephrine.  Hemostasis was good.  Sponge,  needle and instrument counts were correct.   I closed the external oblique with running 3-0 Vicryl.  I closed the deep  subcutaneous tissues with running 3-0 Vicryl, and closed the skin with  intracuticular interrupted 4-0 Vicryl and Steri-Strips.   The patient tolerated the procedure well.  He was stable throughout.                                                Lorre Munroe., M.D.    Jodi Marble  D:  03/18/2003  T:  03/18/2003  Job:  [id number]   cc:   Holley Bouche, M.D.  [address]  [place], [place] [address]  Fax: [phone]   P. Liliane Bade, M.D.  [address]  [place]  [place] [address]   Lindaann Slough, M.D.  [address]  [place]  [place] [address]  Fax: [phone]

## 2010-08-02 NOTE — Discharge Summary (Signed)
NAME:  Ricky Nguyen, KORTE NO.:  [id number]   MEDICAL RECORD NO.:  [id number]                   PATIENT TYPE:  INP   LOCATION:  3704                                 FACILITY:  [facility]   PHYSICIAN:  Francisca December, M.D.               DATE OF BIRTH:  10-Aug-1916   DATE OF ADMISSION:  02/23/2003  DATE OF DISCHARGE:  02/25/2003                                 DISCHARGE SUMMARY   CONSULTATIONS:  Larina Earthly, M.D., CVTS.   CHIEF COMPLAINT:  The patient is an 76 year old male with multiple vascular  issues who had diagnostic left heart catheterization today in anticipation  for AAA repair in the future per Dr. Madilyn Fireman.  He had undergone a left  brachial access/approach due to his vascular disease. Subsequently, the  patient developed a left brachial hematoma with apparently some  neurovascular involvement as well as some orthostatic hypotension with  significant hematoma formation in the left antecubital area.  Therefore, the  patient was admitted to [facility] with a diagnosis of left  antecubital hematoma formation, orthostatic hypotension, and known vascular  disease.   HOSPITAL COURSE:  Problem 1.  Left brachial sheath hematoma.  As stated, the  patient was admitted to the telemetry unit for further observation.  He had  significant pain which was relieved with the use of morphine.  He had some  difficulty with numbness and tingling in the left hand and because of this,  the area on the left arm had ultrasound procedure done which showed a  vascular aneurysm.  He underwent an ultrasound of the left antecubital area  to rule out left brachial aneurysm versus significant hematoma formation  with associated nerve and other vascular compression.  The ultrasound  revealed no aneurysm, but he did have a brachial sheath hematoma with  associated nerve root compression.  CVTS was consulted and Dr. Arbie Cookey took  the patient to the operating room on February 24, 2003, at which time an  evacuation of the left brachial hematoma with closure of arterial puncture  site was done.  The patient tolerated the procedure well and gradually  improved with sensation to the left arm and hand, ie, the pins and needles  and tingling sensation was resolving.  By February 25, 2003, the patient was  cleared for discharge per CVTS and therefore was discharged home per Dr.  Elease Hashimoto.   DISCHARGE DIAGNOSES:  1. Status post diagnostic left heart catheterization with normal coronaries.  2. Left brachial sheath hematoma with associated nerve compression,     resolving.  3. Significant peripheral vascular disease, abdominal aortic aneurysm repair     pending per Dr. Madilyn Fireman.   DISCHARGE MEDICATIONS:  Listed as continue previous medications.   ACTIVITY:  Per Dr. Bosie Helper recommendations.   WOUND CARE:  Per Dr. Bosie Helper recommendations.   FOLLOW UP:  The patient needs to call and schedule an appointment to see  Dr.  Amil Amen.  He also needs to call Dr. Bosie Helper office for follow-up as well.     Allison L. Rolene Course                    Francisca December, M.D.   ALE/MEDQ  D:  03/13/2003  T:  03/13/2003  Job:  [id number]

## 2010-08-02 NOTE — H&P (Signed)
NAME:  Ricky Nguyen, Ricky Nguyen                       ACCOUNT NO.:  [id number]   MEDICAL RECORD NO.:  [id number]                   PATIENT TYPE:  INP   LOCATION:  1610                                 FACILITY:  [facility]   PHYSICIAN:  Lindaann Slough, M.D.               DATE OF BIRTH:  [birth year]   DATE OF ADMISSION:  06/23/2003  DATE OF DISCHARGE:                                HISTORY & PHYSICAL   CHIEF COMPLAINT:  Inability to urinate.   HISTORY OF PRESENT ILLNESS:  Patient is an 77 year old male who had a TUR of  bladder tumor in October, 2004.  Postoperatively, he went into urinary  retention.  He was treated with Flomax and Uroxatral; however, he failed  three voiding trials.  He had a right incarcerated inguinal hernia repair on  March 18, 2003, then abdominal aortic resection on April 04, 2003.  He  has had an indwelling Foley catheter since.  He is not scheduled for TURP.   PAST MEDICAL HISTORY:  He has no history of hypertension or diabetes.   PAST SURGICAL HISTORY:  He had right inguinal hernia repair on March 18, 2003 and abdominal aortic resection on April 04, 2003.  He had a  tonsillectomy as a child.   FAMILY HISTORY:  His father died of congestive heart failure and emphysema  at age 80.  His mother died at age 92.  He has one brother and one sister.   SOCIAL HISTORY:  He is married.  Has five children.  He used to smoke 2-4  cigars a day for 30 years, and he quit in 1984.  He drinks 1-2 glasses of  wine every day.   MEDICATIONS:  None.   ALLERGIES:  He has no known drug allergies.   REVIEW OF SYSTEMS:  He has no cough.  No shortness of breath.  No  hemoptysis.  No chest pain.  No palpitations.  No nausea.  No vomiting.  No  diarrhea or constipation.  He has an indwelling Foley catheter for urinary  retention.   PHYSICAL EXAMINATION:  VITAL SIGNS:  Blood pressure is 118/80, pulse 76,  respirations 16, temperature 97.  GENERAL:  He is a well-built 77 year old  male in no acute distress.  HEENT:  His head is normal.  Pupils are equal and reactive to light.  Ears,  nose, and throat within normal limits.  NECK:  Supple.  No cervical lymph nodes and no thyromegaly.  CHEST:  Symmetrical.  Lungs are fully expanded and clear to auscultation and  percussion.  HEART:  Regular rhythm.  No murmurs or gallops.  ABDOMEN:  Soft.  Nondistended.  Nontender.  He has a well-healed surgical  scar.  Liver, spleen, and kidneys are not palpable.  Bowel sounds are  normal.  GENITOURINARY:  Penis is uncircumcised.  The meatus is normal.  He has an  indwelling Foley catheter that is draining clear urine.  The  scrotum is  normal in appearance.  Both testicles, cords, and epididymis are within  normal limits.  RECTAL:  Sphincter tone is normal.  Prostate is enlarged at 50 gm.  No  nodules.  Seminal vesicles are not palpable.   IMPRESSION:  1. Urinary retention.  2. Benign prostatic hypertrophy.  3. Status post transurethral resection of a bladder tumor.  4. Status post right inguinal hernia repair.  5. Status post abdominal aortic resection.                                               Lindaann Slough, M.D.    MN/MEDQ  D:  06/23/2003  T:  06/23/2003  Job:  [id number]

## 2010-08-02 NOTE — Discharge Summary (Signed)
NAME:  Ricky Nguyen, Ricky Nguyen                       ACCOUNT NO.:  [id number]   MEDICAL RECORD NO.:  [id number]                   PATIENT TYPE:  INP   LOCATION:  2029                                 FACILITY:  [facility]   PHYSICIAN:  Balinda Quails, M.D.                 DATE OF BIRTH:  Jul 18, 1916   DATE OF ADMISSION:  04/04/2003  DATE OF DISCHARGE:  04/12/2003                                 DISCHARGE SUMMARY   ADDENDUM:  Ricky Nguyen was originally scheduled to go home on 04/11/2003.  However, when he was seen that morning, he was very nauseated and was not  eating well. He was given Zofran for the nausea symptomatically and was  started on Protonix and Reglan. Over the course of the next 24 hours, his  nausea resolved. He has had continued flatus and bowel movements as before.  He has not been vomiting. Presently, he is without any symptoms. He is  tolerating his diet as before. He has continued to ambulate in the halls. He  has remained afebrile and all vitals signs have been stable otherwise. It is  felt that since he is improving from a GI standpoint and has remained stable  over the past 24 hours, he can be discharged to home at this time.   DISCHARGE MEDICATIONS:  1. Protonix 40 mg daily.  2. Reglan 10 mg q. AC and h.s.  3. Percocet one to two q.4 hours p.r.n. for pain.  4. Ambien 0.5 to 1 mg t.i.d. p.r.n.  5. Peridium as needed.  6. Colace 100 mg b.i.d.  7. Milk of magnesia p.r.n.   The remainder of his discharge instructions and follow up appointments have  not changed from the previously dictated discharge summary. He is asked to  call our office immediately if he experiences any problems or has questions  after he gets home.      Coral Ceo, P.A.                        Balinda Quails, M.D.    GC/MEDQ  D:  04/12/2003  T:  04/12/2003  Job:  [id number]   cc:   Lindaann Slough, M.D.  [address]  [place]  [place] [address]  Fax: [phone]   Francisca December, M.D.  [address]  [place]  [place] [address]  Fax: [phone]   Holley Bouche, M.D.  [address]  [place], [place] [address]  Fax: [phone]

## 2010-08-02 NOTE — Op Note (Signed)
   NAME:  Ricky Nguyen, Ricky Nguyen                         ACCOUNT NO.:  [id number]   MEDICAL RECORD NO.:  [id number]                   PATIENT TYPE:  OUT   LOCATION:  [facility]C                                 [facility]ACILITY:  [facility]   PHYSICIAN:  Lindaann Slough, M.D.               DATE OF BIRTH:  February 07, 1917   DATE OF PROCEDURE:  01/03/2003  DATE OF DISCHARGE:  01/03/2003                                 OPERATIVE REPORT   ADDENDUM:  The size of the intravesical tumor was approximately 6 cm in surface area  making this a large bladder tumor.     Susanne Borders, MD                           Lindaann Slough, M.D.    DR/MEDQ  D:  01/16/2003  T:  01/16/2003  Job:  [id number]

## 2010-08-02 NOTE — Op Note (Signed)
NAME:  Ricky Nguyen, Ricky Nguyen NO.:  [id number]   MEDICAL RECORD NO.:  [id number]                   PATIENT TYPE:  OIB   LOCATION:  2891                                 FACILITY:  [facility]   PHYSICIAN:  Janetta Hora. Fields, MD               DATE OF BIRTH:  12-13-1916   DATE OF PROCEDURE:  01/24/2003  DATE OF DISCHARGE:                                 OPERATIVE REPORT   PREOPERATIVE DIAGNOSIS:  Bilateral iliac aneurysms.   POSTOPERATIVE DIAGNOSIS:  Bilateral iliac aneurysms.   INDICATIONS FOR PROCEDURE:  The patient has known bilateral iliac aneurysms  and needs aortography for preoperative planning.   FINDINGS:  1. Bilateral common iliac artery aneurysms.  2. Bilateral internal iliac artery aneurysms.  3. Bilateral single patent renal arteries.  4. Patent celiac and SMA with no significant flow-limiting stenosis.   DESCRIPTION OF PROCEDURE:  After obtaining informed consent, the patient was  taken to the Angio suite.  The patient was placed in the supine position on  the angio table.  Both groins were prepped and draped in the usual sterile  fashion.  Next, local anesthesia was infiltrated over the right common  femoral artery.  Majestic needle was used to cannulate the right common  femoral artery.  A 0.035 J-tip guide wire was then advanced into the right  iliac artery system.  A 5 French sheath was then placed over the guide wire.  Next, the J-tip guide wire was advanced into the abdominal aorta under  fluoroscopic guidance.  A 5 French pigtail catheter was then placed over the  guide wire and into the abdominal aorta.  Next, an aortogram was performed  from the level of the celiac axis.  This was done in an AP projection  initially.  This showed bilateral common iliac artery aneurysms.  There were  also bilateral internal iliac artery aneurysms.  There is extension of the  right internal iliac artery aneurysm down to the level of the first  bifurcation  and in a similar fashion on the left internal iliac artery  aneurysm.  The external iliac artery is normal in caliber, but quite  tortuous on the right side.  The left external iliac artery is also widely  patent. There were also bilateral single patent renal arteries bilaterally.  The distance from the lowest renal artery which is the left side to the  aortic bifurcation is 17 cm.  The distance from the aortic bifurcation to  the iliac bifurcation is 7 cm on the right side. This is most likely longer  on the left side due to tortuosity and elongation of the iliac artery.   Next, a lateral view of the aorta was obtained and the celiac and superior  mesenteric arteries are widely patent at their origins.  Superior mesenteric  artery is also widely patent.   Next, the pigtail catheter was pulled down to the level of the  aortic  bifurcation and a pelvic aortogram was performed.  This was also done in a  30 degree RAO and a 30 degree LAO projection.  There is no significant  stenosis within either internal iliac artery aneurysm.  The external iliac  arteries and common femoral arteries are widely patent.  The profunda  femoris artery bilaterally is patent.  The origin of the SFA is patent  bilaterally.   Next, the pigtail catheter was removed over a guide wire.  The 5 French  sheath was then pulled and hemostasis was obtained with pressure.  The  patient tolerated the procedure well and there were no complications.                                               Janetta Hora. Fields, MD    CEF/MEDQ  D:  01/24/2003  T:  01/24/2003  Job:  [id number]

## 2010-08-02 NOTE — H&P (Signed)
NAME:  Ricky Nguyen, Ricky Nguyen NO.:  [id number]   MEDICAL RECORD NO.:  [id number]                   PATIENT TYPE:  INP   LOCATION:  0101                                 FACILITY:  [facility]   PHYSICIAN:  Lorre Munroe., M.D.            DATE OF BIRTH:  05-13-1916   DATE OF ADMISSION:  03/18/2003  DATE OF DISCHARGE:                                HISTORY & PHYSICAL   CHIEF COMPLAINT:  Abdominal pain.   PRESENT ILLNESS:  Patient is an 77 year old white male who has been taking  Percocet for sciatica and had become constipated.  He was straining to have  a bowel movement and developed pain in the right inguinal region and  subsequently a crampy band-like pain across the lower abdomen.  He had had a  little nausea but no vomiting.  At the emergency department he was found to  have a tender mass in the right groin and I was consulted.  He had no  history previously of hernia.  He has not had a fever or chills.   PAST MEDICAL HISTORY:  Patient has had bladder cancer recently treated by  transurethral resection by Dr. Brunilda Payor and has an indwelling Foley catheter.  He is known to have bilateral iliac aneurysms and repair was planned by Dr.  Madilyn Fireman later this month.  He denies heart and lung problems.  He takes Peri-  Colace, recently was on prednisone for his sciatica, Ambien, Ativan, and  Dulcolax.  He denied medication allergies.  No abdominal operations.  He  does not smoke or drink.  Family history and childhood illnesses are  unremarkable.   REVIEW OF SYSTEMS:  He denies any symptoms of infection.  He denies chest  pain or shortness of breath.  He says he has no renal problems.   PHYSICAL EXAMINATION:  The temperature and vital signs unremarkable as  recorded by nursing staff.  Patient is in no acute distress and his mental  status is normal.  The head, neck, eyes, ears, nose, mouth and throat are  unremarkable with no enlargement of the thyroid and there is no  lymphadenopathy in the neck.  The chest is clear to auscultation.  Heart  rate and rhythm normal, no murmur or gallop.  Abdomen:  Slight distention,  no generalized tenderness.  Bowel sounds hyperactive.  There is a tender  mass in the right groin region which does not reduce.  The left testicle is  absent.  Right testicle is normal.  Extremities:  No abnormalities noted.  Skin:  No lesions noted.  Lymph nodes:  None enlarged.  Foley catheter in place draining clear urine.   IMPRESSION:  Incarcerated right inguinal hernia.   PLAN:  Immediate repair.  The patient consents to the repair and possible  bowel resection.  We will proceed immediately.  Lorre Munroe., M.D.    Jodi Marble  D:  03/18/2003  T:  03/18/2003  Job:  [id number]

## 2010-08-02 NOTE — Discharge Summary (Signed)
NAME:  Ricky Nguyen, Ricky Nguyen NO.:  [id number]   MEDICAL RECORD NO.:  [id number]                   PATIENT TYPE:  INP   LOCATION:  0481                                 FACILITY:  [facility]   PHYSICIAN:  Lorre Munroe., M.D.            DATE OF BIRTH:  [birth year]   DATE OF ADMISSION:  03/18/2003  DATE OF DISCHARGE:  03/20/2003                                 DISCHARGE SUMMARY   HISTORY:  Patient is an 77 year old white male who was admitted to the  hospital because of abdominal pain and painful tender bulge in the right  inguinal region.  I saw him and judged it to be an incarcerated right  inguinal hernia.  He was having crampy abdominal pain.  There was nausea  without vomiting.  The patient has had bladder cancer treated by  transurethral resection and has a Foley catheter.  He has bilateral iliac  aneurysms with repair planned in the near future by Dr. Madilyn Fireman.  Please see  the History and Physical for further details.  There were no conditions  contraindicating immediate surgery.   PHYSICAL EXAMINATION:  There was a tender right inguinal mass.  There were  hyperactive bowel sounds.  There was an indwelling Foley catheter.   HOSPITAL COURSE:  As soon as possible I took the patient to the operating  room and repaired incarcerated right inguinal hernia.  I was able to inspect  the incarcerated small bowel after the hernia was reduced and was found to  be viable.  Postoperatively he did quite well with relief of his abdominal  cramps and pain.  He had slight nausea and distention the first  postoperative day but was able to take liquids and his diet was advanced so  that by the second postoperative day he was eating well and felt well enough  to go home.  The incision looked good.  He was discharged and arrangements  made for follow-up in the office.   DIAGNOSIS:  1. Incarcerated right inguinal hernia.  2. Cancer of the bladder, status post treatment.  3.  Bilateral iliac aneurysms.   OPERATION:  Repair of incarcerated right inguinal hernia.   DISCHARGE CONDITION:  Improved.                                               Lorre Munroe., M.D.    WB/MEDQ  D:  03/29/2003  T:  03/29/2003  Job:  [id number]

## 2010-08-02 NOTE — Discharge Summary (Signed)
NAME:  Ricky Nguyen, Ricky Nguyen                       ACCOUNT NO.:  [id number]   MEDICAL RECORD NO.:  [id number]                   PATIENT TYPE:  INP   LOCATION:  2029                                 FACILITY:  [facility]   PHYSICIAN:  Balinda Quails, M.D.                 DATE OF BIRTH:  Oct 31, 1916   DATE OF ADMISSION:  04/04/2003  DATE OF DISCHARGE:  04/11/2003                                 DISCHARGE SUMMARY   PRIMARY ADMITTING DIAGNOSIS:  Bilateral iliac artery aneurysms.   ADDITIONAL/DISCHARGE DIAGNOSES:  1. Bilateral iliac artery aneurysms.  2. Bilateral hypogastric artery aneurysms.  3. History of bladder cancer, status post transurethral resection of bladder     tumor on January 02, 2003.  4. Chronic indwelling Foley catheter secondary to #1.  5. History of repair of incarcerated inguinal hernia on the right, March 18, 2003.  6. Status post evacuation of left brachial sheath hematoma by Dr. Larina Earthly on February 25, 2003.  7. Postoperative anemia.   PROCEDURES PERFORMED:  1. Repair of bilateral common iliac artery aneurysms and hypogastric artery     aneurysms with 16-mm Hemashield aorto-bi-iliac artery bypass graft.  2. Reimplantation of inferior mesenteric artery.   HISTORY:  The patient is an 77 year old male with a history of carcinoma of  bladder, who underwent transurethral resection of bladder tumor in October  of 2004.  On a preoperative CT scan of the abdomen, he was noted  incidentally to have bilateral iliac artery aneurysms.  The patient was seen  in consultation by Dr. Balinda Quails, who felt that arteriography was  indicated to further delineate the patient's anatomy.  This was performed on  January 24, 2003 and showed bilateral internal iliac artery aneurysms,  bilateral patent renal arteries and patent celiac and superior mesenteric  arteries without flow-limiting stenosis.  It was Dr. Florina Ou opinion that  the patient was at increased risk of rupture  due to the size of these  aneurysms and he recommended proceeding with elective repair at this time.  The patient underwent a complete preoperative workup including cardiac  catheterization for clearance prior to proceeding with surgery.   HOSPITAL COURSE:  He was admitted to [facility] on April 04, 2003  and underwent a repair of his bilateral iliac and hypogastric artery  aneurysm as described in detail above.  He tolerated the procedure well and  was transferred to the SICU in stable condition.  He was able to be  extubated shortly after surgery.  He was hemodynamically stable and doing  well on postop day 1.  He was mobilized.  He was noted to be anemic with a  hemoglobin of 6.5 and brought his hemoglobin to 7.3.  He was transfused an  additional unit and bumped up to 7.4.  Once again, he was transfused an  additional unit of packed red blood  cells and his hemoglobin and hematocrit  stabilized by postop day 4 at 8.2 and 24, respectively.  By postop day 2, he  was doing well, other than the anemia, and was transferred to the floor.  His bowel function has slowly returned.  By postop day 4, he had had a small  bowel movement and was started on a clear liquid diet.  He had some mild  nausea but continued to pass flatus and has regular bowel movements and his  diet was advanced to a soft regular diet.  He has remained afebrile and all  vital signs have been stable.  His nausea has resolved presently and he is  tolerating a regular diet without problem.  He is ambulating in the halls  without difficulty.  He has maintained strong palpable pulses in his lower  extremities.  His abdominal incision is healing well.  His most recent labs  show a hemoglobin of 9.1, hematocrit 27.3, platelets 140,000, white count  9.4 and his most recent basic metabolic panel shows a potassium of 3.7, BUN  15, creatinine 0.8.  It is felt that if he continues to remain stable over  the next 24 hours, he  should hopefully be able to be discharged home on  April 11, 2003.  He does have indwelling Foley catheter and he has been  making urine without problem.   DISCHARGE MEDICATIONS:  1. Pepcid 20 mg b.i.d.  2. Percocet 1 to 2 q.4 h. p.r.n. for pain.  3. Ambien 0.5 to 1 mg t.i.d. and p.r.n.  4. Pyridium as needed for bladder spasm.  5. Colace 100 mg b.i.d.  6. Milk of Magnesia daily p.r.n.   DISCHARGE INSTRUCTIONS:  He is to refrain from driving, heavy lifting or  strenuous activity.  He may continue daily ambulation and use of his  incentive spirometer.  He is asked to continue his same preoperative diet.  He may shower daily and clean his incisions with soap and water.   DISCHARGE FOLLOWUP:  The CVTS nurse will remove his staples in 1 week and  also perform a wound check.  He will then follow up with Dr. Madilyn Fireman on  Monday, May 01, 2003, at 9:20 a.m.  He is asked to call and set up an  appointment with Dr. Lindaann Slough as directed.  He is asked to call our  office if he develops any problems or has any questions upon discharge.      Coral Ceo, P.A.                        Balinda Quails, M.D.    GC/MEDQ  D:  04/10/2003  T:  04/11/2003  Job:  [id number]   cc:   Lindaann Slough, M.D.  [address]  [place]  [place] [address]  Fax: [phone]   Francisca December, M.D.  [address]  [place]  [place] [address]  Fax: [phone]   Holley Bouche, M.D.  [address]  [place], [place] [address]  Fax: [phone]

## 2010-08-02 NOTE — Op Note (Signed)
NAME:  Ricky Nguyen, Ricky Nguyen                       ACCOUNT NO.:  [id number]   MEDICAL RECORD NO.:  [id number]                   PATIENT TYPE:  INP   LOCATION:  0454                                 FACILITY:  [facility]   PHYSICIAN:  Lindaann Slough, M.D.               DATE OF BIRTH:  Sep 30, 1916   DATE OF PROCEDURE:  06/23/2003  DATE OF DISCHARGE:  06/24/2003                                 OPERATIVE REPORT   PREOPERATIVE DIAGNOSES:  1. Benign prostatic hypertrophy.  2. Urinary retention.   POSTOPERATIVE DIAGNOSES:  1. Benign prostatic hypertrophy.  2. Urinary retention.   PROCEDURE:  1. Cystoscopy.  2. Transurethral resection of prostate.   ANESTHESIA:  Spinal.   SURGEON:  Lindaann Slough, M.D.   ASSISTANT:  Thyra Breed, MD   COMPLICATIONS:  None.   DRAINS:  22 French three-way Foley catheter to straight drain (continuous  bladder irrigation).   INDICATIONS FOR PROCEDURE:  [name] is a very pleasant 77 year old male  whose originally underwent a TURBT in October of 2004.  Postoperatively he  suffered from urinary retention. He was then tried on alpha blocker therapy  including Flomax and UroXatral.  However, he failed three voiding trials.  Following this a TURP was discussed with the patient.  However, since that  time, the patient has undergone an inguinal hernia repair for an  incarcerated bowel as well as an elective repair of an abdominal aortic  aneurysm.  The patient's bladder has been managed by an indwelling Foley  catheter. At this time, the patient is brought to the operating room to  undergo transurethral resection of the prostate.  The patient has been  informed of the risks, benefits, and alternatives of the procedure and is  willing to proceed. Informed consent has been obtained.   DESCRIPTION OF PROCEDURE:  Following identification by his arm bracelet, the  patient was brought to the operating room where he underwent spinal  anesthesia.  The patient then  received preoperative IV antibiotics including  Cipro and gentamycin.  Of note, the patient had a urine culture 7-10 days  earlier which was positive for pseudomonas sensitive to Cipro.  The patient  has been on Cipro for approximately seven days prior to this procedure.  The  patient was then placed in the dorsal lithotomy position and his perineum  and genitalia prepped and draped in the usual sterile fashion.  The  cystourethroscopy was initially performed using a 22 French sheath and a 12  degree cystoscopic lens. This revealed a normal appearing anterior urethra  as well as bilobar hypertrophy in the area of the prostatic urethra. Upon  entry into the bladder, both the left and the right ureteral orifice were in  their normal anatomic location effluxing clear urine.  The bladder was 2-3  plus trabeculated with several diverticulum consistent with previous outlet  obstruction.  Further inspection of the bladder with both the 12 and 70  degree cystoscopic lens revealed no further evidence of papillary lesions,  tumors, foreign bodies or stones.  There bullous edema present within the  bladder mucosa consistent with the patient's indwelling Foley catheter.  At  this time, the cystoscope was removed. The resectoscope was assembled prior  to placing the resectoscope, the urethra was dilated using [employer] sounds  to approximately 30 [place]. This allowed the 28 French resectoscope sheath  to pass without difficulty into the bladder.  Continuous flow was the hooked  to the resectoscope using glycine as an irrigant. Using the cutting current  with the loop, an initial channel was created along the median lobe from the  bladder neck to the level of the verumontanum.  The patient's median lobe  was actually not enlarged at all.  With some minor resection on the median  lobe, we then turned our attention to the right lateral lobe.  Resection  continued until capsule was evident beneath the  prostatic tissue.  All  bleeding was fulgurated using the coag current on the loop.  We then turned  our attention to the left lateral lobe which was also resected in its  entirety.  Excellent hemostasis was obtained again using the coagulation  current of the loop.  Resection then continued in a circumferential fashion  in the area of 10 to 2 o'clock.  Care was taken during the entire resection  to avoid the ureteral orifices.  Care was also taken to limit the dissection  near the area of the verumontanum distally.  Once sufficient transurethral  resection had created a wide open channel, the Ellik evacuator was used to  remove all prostatic chips from the patient's bladder.  Following this  maneuver, the resectoscope was again used to check for any remaining chips  within the patient's bladder.  At this time, the prostatic urethra was  visualized with flow in the off position. This showed no further evidence of  active bleeding within the prostate.  Therefore the resectoscope was then  removed.  A 22 French three-way soft Foley catheter was then easily inserted  into the bladder with good return of pink urine.  30 mL was used to fill the  balloon and the catheter was found to irrigate easily. This was connected to  continuous bladder irrigation with normal saline.  The Foley catheter was  then connected to a drainage bag and this marked termination of the  procedure. The patient tolerated the procedure well and there were no  complications.  Please note that Dr. Brunilda Payor was present and participated in  the entire procedure as he was the responsible surgeon.   DISPOSITION:  Following the procedure, the patient was transported to the  post anesthesia care unit in stable condition.  From here, he will be  transferred to the floor for overnight observation, recovery from spinal  anesthesia, and possible Foley catheter removal in the morning prior to  discharge.     Thyra Breed, MD                             Lindaann Slough, M.D.    EG/MEDQ  D:  06/23/2003  T:  06/23/2003  Job:  [id number]

## 2010-08-02 NOTE — Discharge Summary (Signed)
NAME:  Ricky Nguyen, Ricky Nguyen                       ACCOUNT NO.:  [id number]   MEDICAL RECORD NO.:  [id number]                   PATIENT TYPE:  INP   LOCATION:  0981                                 FACILITY:  [facility]   PHYSICIAN:  Lindaann Slough, M.D.               DATE OF BIRTH:  1916-03-20   DATE OF ADMISSION:  06/23/2003  DATE OF DISCHARGE:  06/26/2003                                 DISCHARGE SUMMARY   DISCHARGE DIAGNOSES:  1. Urinary retention.  2. Benign prostatic hypertrophy, status post transurethral resection of     bladder tumor.  3. Status post abdominal aortic aneurysm.   PROCEDURE:  Cystoscopy and TURP on June 23, 2003.   The patient is an 77 year old male who went into urinary retention in  October 2004 after a TUR bladder tumor. He was treated with Flomax and  Uroxatral; however, he failed three voiding trials. He had right inguinal  hernia repair in January 2005 and then abdominal aortic aneurysm resection  on April 04, 2003, and he was admitted at this time for TURP and  cystoscopy.   On physical examination his blood pressure was 118/80, pulse 76,  respirations 16, temperature 97. Head was normal. Pupils were equal, round,  and reactive to light and accommodation. Ears essentially within normal  limits. Neck supple with no cervical lymph nodes. Chest symmetrical. Lungs  fully expanded and clear to auscultation and percussion. Heart has a regular  rhythm with no murmurs or gallops. Abdomen was soft, nondistended, and  nontender. He has a well healed surgical scar.  Liver, spleen, and kidneys  not palpable. Bowel sounds normal. Genital revealed penis uncircumcised and  meatus normal. Testicles, cords, and epididymis within normal limits.  On  rectal examination, sphincter tone was normal and prostate enlarged at 50  gm, no nodules.   His hemoglobin on admission was 12.3, hematocrit 37.5, WBC 6.8. PT and PTT  were within normal limits. Sodium 140, potassium 5.2,  glucose 124, BUN 13,  creatinine 1.1. Urine culture positive for pseudomonas and he was treated  with Cipro as an outpatient.   Chest x-ray showed no evidence of active disease.   The patient had cardiac catheterization done in December 2004 and it  revealed widely patent coronary arteries.  The patient had a cystoscopy and  TURP done on June 23, 2003. Postoperative course was uneventful. He remained  afebrile.  Foley catheter was removed on the second postoperative day. After  removing the Foley he was voiding well, his urine was grossly clear, and he  was then discharged home on June 26, 2003, on Toprol XL 50 mg once daily,  Zoloft 50 mg daily, Cipro XR 500 mg, and Percocet 5/325 one or two tablets  q.6h. p.r.n. pain.   The patient is instructed not to do any lifting, straining, or driving until  further advised.   DISCHARGE DIET:  Regular.   CONDITION ON DISCHARGE:  Improved.  Lindaann Slough, M.D.    MN/MEDQ  D:  06/26/2003  T:  06/26/2003  Job:  [id number]   cc:   Holley Bouche, M.D.  [address]  [place], [place] [address]  Fax: [phone]   P. Liliane Bade, M.D.  [address]  [place]  [place] [address]   Lorre Munroe., M.D.  Fax: [phone]

## 2011-03-30 DIAGNOSIS — H6690 Otitis media, unspecified, unspecified ear: Secondary | ICD-10-CM | POA: Diagnosis not present

## 2011-03-30 DIAGNOSIS — J209 Acute bronchitis, unspecified: Secondary | ICD-10-CM | POA: Diagnosis not present

## 2011-03-31 DIAGNOSIS — C189 Malignant neoplasm of colon, unspecified: Secondary | ICD-10-CM | POA: Diagnosis not present

## 2011-03-31 DIAGNOSIS — H6690 Otitis media, unspecified, unspecified ear: Secondary | ICD-10-CM | POA: Diagnosis not present

## 2011-03-31 DIAGNOSIS — D494 Neoplasm of unspecified behavior of bladder: Secondary | ICD-10-CM | POA: Diagnosis not present

## 2011-04-09 DIAGNOSIS — H7290 Unspecified perforation of tympanic membrane, unspecified ear: Secondary | ICD-10-CM | POA: Diagnosis not present

## 2011-04-09 DIAGNOSIS — H66009 Acute suppurative otitis media without spontaneous rupture of ear drum, unspecified ear: Secondary | ICD-10-CM | POA: Diagnosis not present

## 2011-04-15 DIAGNOSIS — H9209 Otalgia, unspecified ear: Secondary | ICD-10-CM | POA: Diagnosis not present

## 2011-04-15 DIAGNOSIS — F329 Major depressive disorder, single episode, unspecified: Secondary | ICD-10-CM | POA: Diagnosis not present

## 2011-04-23 DIAGNOSIS — H6500 Acute serous otitis media, unspecified ear: Secondary | ICD-10-CM | POA: Diagnosis not present

## 2011-04-23 DIAGNOSIS — J019 Acute sinusitis, unspecified: Secondary | ICD-10-CM | POA: Diagnosis not present

## 2011-05-02 ENCOUNTER — Telehealth: Payer: Self-pay | Admitting: *Deleted

## 2011-05-02 NOTE — Telephone Encounter (Signed)
Received call from pt. Stating that he saw Dr. Cyndie Chime in Jan 2011 & was told to call if he had further problems & would like to see Dr.Granfortuna.  He states that he has had a series of strange BM's, like c-diff.  He reports gas & one time fresh red blood.  He also reports that when stools are formed they are tarry black.  He reports Dr. Ewing Schlein is his GI.  He reports being on some ATB's per Dr. Tiburcio Pea, Dr.Gates, & Dr. Jearld Fenton for ear infections but stopped 04/23/11 but states that he was having these problems before the ATB's.   Suggested he call his PCP, Dr.Harris & report symptoms to see if they want to check for c-diff & get him in with GI to investigate first & if needed after that, can get in with Dr. Cyndie Chime.  Discussed with Dr. Reece Agar & he was in agreement.

## 2011-05-06 DIAGNOSIS — H6980 Other specified disorders of Eustachian tube, unspecified ear: Secondary | ICD-10-CM | POA: Diagnosis not present

## 2011-05-06 DIAGNOSIS — H6520 Chronic serous otitis media, unspecified ear: Secondary | ICD-10-CM | POA: Diagnosis not present

## 2011-05-27 DIAGNOSIS — L821 Other seborrheic keratosis: Secondary | ICD-10-CM | POA: Diagnosis not present

## 2011-05-27 DIAGNOSIS — L570 Actinic keratosis: Secondary | ICD-10-CM | POA: Diagnosis not present

## 2011-05-27 DIAGNOSIS — D1779 Benign lipomatous neoplasm of other sites: Secondary | ICD-10-CM | POA: Diagnosis not present

## 2011-05-27 DIAGNOSIS — Z85820 Personal history of malignant melanoma of skin: Secondary | ICD-10-CM | POA: Diagnosis not present

## 2011-06-03 DIAGNOSIS — R040 Epistaxis: Secondary | ICD-10-CM | POA: Diagnosis not present

## 2011-06-03 DIAGNOSIS — H6980 Other specified disorders of Eustachian tube, unspecified ear: Secondary | ICD-10-CM | POA: Diagnosis not present

## 2011-06-11 DIAGNOSIS — T148XXA Other injury of unspecified body region, initial encounter: Secondary | ICD-10-CM | POA: Diagnosis not present

## 2011-06-17 DIAGNOSIS — B351 Tinea unguium: Secondary | ICD-10-CM | POA: Diagnosis not present

## 2011-06-17 DIAGNOSIS — L600 Ingrowing nail: Secondary | ICD-10-CM | POA: Diagnosis not present

## 2011-06-17 DIAGNOSIS — M79609 Pain in unspecified limb: Secondary | ICD-10-CM | POA: Diagnosis not present

## 2011-07-18 DIAGNOSIS — H2510 Age-related nuclear cataract, unspecified eye: Secondary | ICD-10-CM | POA: Diagnosis not present

## 2011-07-21 DIAGNOSIS — H349 Unspecified retinal vascular occlusion: Secondary | ICD-10-CM | POA: Diagnosis not present

## 2011-07-25 DIAGNOSIS — I119 Hypertensive heart disease without heart failure: Secondary | ICD-10-CM | POA: Diagnosis not present

## 2011-07-25 DIAGNOSIS — K5901 Slow transit constipation: Secondary | ICD-10-CM | POA: Diagnosis not present

## 2011-07-25 DIAGNOSIS — Z9181 History of falling: Secondary | ICD-10-CM | POA: Diagnosis not present

## 2011-07-25 DIAGNOSIS — G8929 Other chronic pain: Secondary | ICD-10-CM | POA: Diagnosis not present

## 2011-07-25 DIAGNOSIS — G4700 Insomnia, unspecified: Secondary | ICD-10-CM | POA: Diagnosis not present

## 2011-07-25 DIAGNOSIS — M6281 Muscle weakness (generalized): Secondary | ICD-10-CM | POA: Diagnosis not present

## 2011-07-25 DIAGNOSIS — F411 Generalized anxiety disorder: Secondary | ICD-10-CM | POA: Diagnosis not present

## 2011-07-25 DIAGNOSIS — I10 Essential (primary) hypertension: Secondary | ICD-10-CM | POA: Diagnosis not present

## 2011-08-01 DIAGNOSIS — G8929 Other chronic pain: Secondary | ICD-10-CM | POA: Diagnosis not present

## 2011-08-01 DIAGNOSIS — R630 Anorexia: Secondary | ICD-10-CM | POA: Diagnosis not present

## 2011-08-01 DIAGNOSIS — R110 Nausea: Secondary | ICD-10-CM | POA: Diagnosis not present

## 2011-08-01 DIAGNOSIS — M545 Low back pain: Secondary | ICD-10-CM | POA: Diagnosis not present

## 2011-08-04 DIAGNOSIS — M545 Low back pain: Secondary | ICD-10-CM | POA: Diagnosis not present

## 2011-08-04 DIAGNOSIS — G4700 Insomnia, unspecified: Secondary | ICD-10-CM | POA: Diagnosis not present

## 2011-08-04 DIAGNOSIS — R110 Nausea: Secondary | ICD-10-CM | POA: Diagnosis not present

## 2011-08-04 DIAGNOSIS — I10 Essential (primary) hypertension: Secondary | ICD-10-CM | POA: Diagnosis not present

## 2011-08-04 DIAGNOSIS — G8929 Other chronic pain: Secondary | ICD-10-CM | POA: Diagnosis not present

## 2011-08-04 DIAGNOSIS — M6281 Muscle weakness (generalized): Secondary | ICD-10-CM | POA: Diagnosis not present

## 2011-08-04 DIAGNOSIS — R630 Anorexia: Secondary | ICD-10-CM | POA: Diagnosis not present

## 2011-08-04 DIAGNOSIS — Z9181 History of falling: Secondary | ICD-10-CM | POA: Diagnosis not present

## 2011-08-04 DIAGNOSIS — F411 Generalized anxiety disorder: Secondary | ICD-10-CM | POA: Diagnosis not present

## 2011-08-05 DIAGNOSIS — H35039 Hypertensive retinopathy, unspecified eye: Secondary | ICD-10-CM | POA: Diagnosis not present

## 2011-08-14 DIAGNOSIS — H35359 Cystoid macular degeneration, unspecified eye: Secondary | ICD-10-CM | POA: Diagnosis not present

## 2011-08-14 DIAGNOSIS — H35369 Drusen (degenerative) of macula, unspecified eye: Secondary | ICD-10-CM | POA: Diagnosis not present

## 2011-08-14 DIAGNOSIS — H348192 Central retinal vein occlusion, unspecified eye, stable: Secondary | ICD-10-CM | POA: Diagnosis not present

## 2011-08-14 DIAGNOSIS — H2510 Age-related nuclear cataract, unspecified eye: Secondary | ICD-10-CM | POA: Diagnosis not present

## 2011-08-25 DIAGNOSIS — M6281 Muscle weakness (generalized): Secondary | ICD-10-CM | POA: Diagnosis not present

## 2011-08-25 DIAGNOSIS — G8929 Other chronic pain: Secondary | ICD-10-CM | POA: Diagnosis not present

## 2011-08-25 DIAGNOSIS — F411 Generalized anxiety disorder: Secondary | ICD-10-CM | POA: Diagnosis not present

## 2011-08-25 DIAGNOSIS — G4700 Insomnia, unspecified: Secondary | ICD-10-CM | POA: Diagnosis not present

## 2011-08-25 DIAGNOSIS — K5901 Slow transit constipation: Secondary | ICD-10-CM | POA: Diagnosis not present

## 2011-08-25 DIAGNOSIS — Z9181 History of falling: Secondary | ICD-10-CM | POA: Diagnosis not present

## 2011-08-25 DIAGNOSIS — I10 Essential (primary) hypertension: Secondary | ICD-10-CM | POA: Diagnosis not present

## 2011-09-22 DIAGNOSIS — J310 Chronic rhinitis: Secondary | ICD-10-CM | POA: Diagnosis not present

## 2011-09-22 DIAGNOSIS — H6980 Other specified disorders of Eustachian tube, unspecified ear: Secondary | ICD-10-CM | POA: Diagnosis not present

## 2011-10-16 ENCOUNTER — Other Ambulatory Visit: Payer: Self-pay | Admitting: Family Medicine

## 2011-10-16 DIAGNOSIS — R109 Unspecified abdominal pain: Secondary | ICD-10-CM | POA: Diagnosis not present

## 2011-10-16 DIAGNOSIS — R1031 Right lower quadrant pain: Secondary | ICD-10-CM

## 2011-10-16 DIAGNOSIS — Z85038 Personal history of other malignant neoplasm of large intestine: Secondary | ICD-10-CM

## 2011-10-20 ENCOUNTER — Ambulatory Visit
Admission: RE | Admit: 2011-10-20 | Discharge: 2011-10-20 | Disposition: A | Discharge status: Home / Self Care | Payer: Medicare Other | Source: Ambulatory Visit | Attending: Family Medicine | Admitting: Family Medicine

## 2011-10-20 DIAGNOSIS — R1031 Right lower quadrant pain: Secondary | ICD-10-CM

## 2011-10-20 DIAGNOSIS — Z85038 Personal history of other malignant neoplasm of large intestine: Secondary | ICD-10-CM

## 2011-10-20 DIAGNOSIS — K439 Ventral hernia without obstruction or gangrene: Secondary | ICD-10-CM | POA: Diagnosis not present

## 2011-10-20 DIAGNOSIS — K5730 Diverticulosis of large intestine without perforation or abscess without bleeding: Secondary | ICD-10-CM | POA: Diagnosis not present

## 2011-10-20 DIAGNOSIS — N400 Enlarged prostate without lower urinary tract symptoms: Secondary | ICD-10-CM | POA: Diagnosis not present

## 2011-10-20 MED ORDER — IOHEXOL 300 MG/ML  SOLN: 100.0000 mL | Freq: Once | INTRAMUSCULAR | Status: AC | PRN

## 2011-11-07 DIAGNOSIS — R1031 Right lower quadrant pain: Secondary | ICD-10-CM | POA: Diagnosis not present

## 2011-11-07 DIAGNOSIS — R933 Abnormal findings on diagnostic imaging of other parts of digestive tract: Secondary | ICD-10-CM | POA: Diagnosis not present

## 2011-11-13 DIAGNOSIS — H2510 Age-related nuclear cataract, unspecified eye: Secondary | ICD-10-CM | POA: Diagnosis not present

## 2011-11-13 DIAGNOSIS — H348192 Central retinal vein occlusion, unspecified eye, stable: Secondary | ICD-10-CM | POA: Diagnosis not present

## 2011-11-13 DIAGNOSIS — H35359 Cystoid macular degeneration, unspecified eye: Secondary | ICD-10-CM | POA: Diagnosis not present

## 2011-11-13 DIAGNOSIS — H35369 Drusen (degenerative) of macula, unspecified eye: Secondary | ICD-10-CM | POA: Diagnosis not present

## 2011-11-27 DIAGNOSIS — L821 Other seborrheic keratosis: Secondary | ICD-10-CM | POA: Diagnosis not present

## 2011-11-27 DIAGNOSIS — L570 Actinic keratosis: Secondary | ICD-10-CM | POA: Diagnosis not present

## 2011-11-27 DIAGNOSIS — Z85820 Personal history of malignant melanoma of skin: Secondary | ICD-10-CM | POA: Diagnosis not present

## 2011-11-28 DIAGNOSIS — Z23 Encounter for immunization: Secondary | ICD-10-CM | POA: Diagnosis not present

## 2012-01-16 DIAGNOSIS — H2510 Age-related nuclear cataract, unspecified eye: Secondary | ICD-10-CM | POA: Diagnosis not present

## 2012-02-17 DIAGNOSIS — R51 Headache: Secondary | ICD-10-CM | POA: Diagnosis not present

## 2012-03-22 DIAGNOSIS — J40 Bronchitis, not specified as acute or chronic: Secondary | ICD-10-CM | POA: Diagnosis not present

## 2012-03-25 DIAGNOSIS — J209 Acute bronchitis, unspecified: Secondary | ICD-10-CM | POA: Diagnosis not present

## 2012-03-25 DIAGNOSIS — R05 Cough: Secondary | ICD-10-CM | POA: Diagnosis not present

## 2012-03-30 DIAGNOSIS — F4323 Adjustment disorder with mixed anxiety and depressed mood: Secondary | ICD-10-CM | POA: Diagnosis not present

## 2012-04-01 DIAGNOSIS — R05 Cough: Secondary | ICD-10-CM | POA: Diagnosis not present

## 2012-04-06 DIAGNOSIS — J189 Pneumonia, unspecified organism: Secondary | ICD-10-CM | POA: Diagnosis not present

## 2012-04-09 DIAGNOSIS — F4323 Adjustment disorder with mixed anxiety and depressed mood: Secondary | ICD-10-CM | POA: Diagnosis not present

## 2012-04-15 DIAGNOSIS — R51 Headache: Secondary | ICD-10-CM | POA: Diagnosis not present

## 2012-04-15 DIAGNOSIS — F4323 Adjustment disorder with mixed anxiety and depressed mood: Secondary | ICD-10-CM | POA: Diagnosis not present

## 2012-04-16 DIAGNOSIS — J189 Pneumonia, unspecified organism: Secondary | ICD-10-CM | POA: Diagnosis not present

## 2012-04-21 DIAGNOSIS — K921 Melena: Secondary | ICD-10-CM | POA: Diagnosis not present

## 2012-04-26 DIAGNOSIS — C189 Malignant neoplasm of colon, unspecified: Secondary | ICD-10-CM | POA: Diagnosis not present

## 2012-04-26 DIAGNOSIS — R1031 Right lower quadrant pain: Secondary | ICD-10-CM | POA: Diagnosis not present

## 2012-04-26 DIAGNOSIS — K921 Melena: Secondary | ICD-10-CM | POA: Diagnosis not present

## 2012-04-27 DIAGNOSIS — F4323 Adjustment disorder with mixed anxiety and depressed mood: Secondary | ICD-10-CM | POA: Diagnosis not present

## 2012-05-13 DIAGNOSIS — H35049 Retinal micro-aneurysms, unspecified, unspecified eye: Secondary | ICD-10-CM | POA: Diagnosis not present

## 2012-05-13 DIAGNOSIS — H35359 Cystoid macular degeneration, unspecified eye: Secondary | ICD-10-CM | POA: Diagnosis not present

## 2012-05-13 DIAGNOSIS — H348192 Central retinal vein occlusion, unspecified eye, stable: Secondary | ICD-10-CM | POA: Diagnosis not present

## 2012-05-19 DIAGNOSIS — H35359 Cystoid macular degeneration, unspecified eye: Secondary | ICD-10-CM | POA: Diagnosis not present

## 2012-05-19 DIAGNOSIS — H348192 Central retinal vein occlusion, unspecified eye, stable: Secondary | ICD-10-CM | POA: Diagnosis not present

## 2012-05-22 DIAGNOSIS — T23009A Burn of unspecified degree of unspecified hand, unspecified site, initial encounter: Secondary | ICD-10-CM | POA: Diagnosis not present

## 2012-05-25 DIAGNOSIS — K5900 Constipation, unspecified: Secondary | ICD-10-CM | POA: Diagnosis not present

## 2012-06-11 DIAGNOSIS — N401 Enlarged prostate with lower urinary tract symptoms: Secondary | ICD-10-CM | POA: Diagnosis not present

## 2012-06-11 DIAGNOSIS — C679 Malignant neoplasm of bladder, unspecified: Secondary | ICD-10-CM | POA: Diagnosis not present

## 2012-07-16 DIAGNOSIS — H348192 Central retinal vein occlusion, unspecified eye, stable: Secondary | ICD-10-CM | POA: Diagnosis not present

## 2012-07-16 DIAGNOSIS — H2510 Age-related nuclear cataract, unspecified eye: Secondary | ICD-10-CM | POA: Diagnosis not present

## 2012-07-22 DIAGNOSIS — Z85820 Personal history of malignant melanoma of skin: Secondary | ICD-10-CM | POA: Diagnosis not present

## 2012-07-22 DIAGNOSIS — L219 Seborrheic dermatitis, unspecified: Secondary | ICD-10-CM | POA: Diagnosis not present

## 2012-07-22 DIAGNOSIS — L821 Other seborrheic keratosis: Secondary | ICD-10-CM | POA: Diagnosis not present

## 2012-07-22 DIAGNOSIS — L570 Actinic keratosis: Secondary | ICD-10-CM | POA: Diagnosis not present

## 2012-07-22 DIAGNOSIS — L259 Unspecified contact dermatitis, unspecified cause: Secondary | ICD-10-CM | POA: Diagnosis not present

## 2012-09-15 DIAGNOSIS — R040 Epistaxis: Secondary | ICD-10-CM | POA: Diagnosis not present

## 2012-09-15 DIAGNOSIS — J019 Acute sinusitis, unspecified: Secondary | ICD-10-CM | POA: Diagnosis not present

## 2012-10-07 DIAGNOSIS — Z1331 Encounter for screening for depression: Secondary | ICD-10-CM | POA: Diagnosis not present

## 2012-10-07 DIAGNOSIS — Z79899 Other long term (current) drug therapy: Secondary | ICD-10-CM | POA: Diagnosis not present

## 2012-10-07 DIAGNOSIS — R109 Unspecified abdominal pain: Secondary | ICD-10-CM | POA: Diagnosis not present

## 2012-10-07 DIAGNOSIS — J309 Allergic rhinitis, unspecified: Secondary | ICD-10-CM | POA: Diagnosis not present

## 2012-10-07 DIAGNOSIS — R51 Headache: Secondary | ICD-10-CM | POA: Diagnosis not present

## 2012-10-07 DIAGNOSIS — Z0000 Encounter for general adult medical examination without abnormal findings: Secondary | ICD-10-CM | POA: Diagnosis not present

## 2012-12-13 DIAGNOSIS — M25519 Pain in unspecified shoulder: Secondary | ICD-10-CM | POA: Diagnosis not present

## 2012-12-16 DIAGNOSIS — H348192 Central retinal vein occlusion, unspecified eye, stable: Secondary | ICD-10-CM | POA: Diagnosis not present

## 2013-01-28 ENCOUNTER — Other Ambulatory Visit: Payer: Self-pay | Admitting: Geriatric Medicine

## 2013-01-28 DIAGNOSIS — R109 Unspecified abdominal pain: Secondary | ICD-10-CM

## 2013-01-28 DIAGNOSIS — C189 Malignant neoplasm of colon, unspecified: Secondary | ICD-10-CM | POA: Diagnosis not present

## 2013-01-31 ENCOUNTER — Ambulatory Visit
Admission: RE | Admit: 2013-01-31 | Discharge: 2013-01-31 | Disposition: A | Discharge status: Home / Self Care | Payer: Medicare Other | Source: Ambulatory Visit | Attending: Geriatric Medicine | Admitting: Geriatric Medicine

## 2013-01-31 DIAGNOSIS — R109 Unspecified abdominal pain: Secondary | ICD-10-CM

## 2013-01-31 DIAGNOSIS — K5730 Diverticulosis of large intestine without perforation or abscess without bleeding: Secondary | ICD-10-CM | POA: Diagnosis not present

## 2013-01-31 MED ORDER — IOHEXOL 300 MG/ML  SOLN
125.0000 mL | Freq: Once | INTRAMUSCULAR | Status: AC | PRN
  Administered 2013-01-31: 125 mL via INTRAVENOUS

## 2013-02-28 ENCOUNTER — Encounter (INDEPENDENT_AMBULATORY_CARE_PROVIDER_SITE_OTHER): Payer: Self-pay

## 2013-03-01 ENCOUNTER — Ambulatory Visit (INDEPENDENT_AMBULATORY_CARE_PROVIDER_SITE_OTHER): Payer: Medicare Other | Admitting: General Surgery

## 2013-03-01 ENCOUNTER — Encounter (INDEPENDENT_AMBULATORY_CARE_PROVIDER_SITE_OTHER): Payer: Self-pay | Admitting: General Surgery

## 2013-03-01 VITALS — BP 148/88 | HR 65 | Temp 97.7°F | Resp 16 | Ht 67.5 in | Wt 211.0 lb

## 2013-03-01 DIAGNOSIS — G8929 Other chronic pain: Secondary | ICD-10-CM | POA: Diagnosis not present

## 2013-03-01 DIAGNOSIS — R1031 Right lower quadrant pain: Secondary | ICD-10-CM | POA: Diagnosis not present

## 2013-03-01 NOTE — Progress Notes (Signed)
Patient ID: Ricky Nguyen, male   DOB: 1916-05-06, 79 y.o.   MRN: [id number]  Chief Complaint  Patient presents with  . Abdominal Pain    HPI R O Ricky Nguyen is a 79 y.o. male.   Abdominal Pain Associated symptoms: constipation and cough     He is referred by Dr. Larina Bras dictating for some chronic right lower quadrant pain. He underwent emergency repair an incarcerated right inguinal hernia a number of years ago. He has undergone a significant amount of weight in the abdominal area. For the past year or 2 he said some intermittent sharp pains in the right lower quadrant area. These become more consistent now. It does not seem to be related to bowel movements.  No relieving factors. No nausea or vomiting.  CT scan did not demonstrate any acute pathology or evidence of recurrent inguinal hernia.  Past Medical History  Diagnosis Date  . Depression   . Bladder tumor   . BPH (benign prostatic hyperplasia)   . Cancer     Colon    Past Surgical History  Procedure Laterality Date  . Transurethral resection of prostate  06/2003  . Hernia repair      RIH repair/incarcerated  . Aortic ileac bypsss    . Colon surgery      Colorectal cancer    History reviewed. No pertinent family history.  Social History History  Substance Use Topics  . Smoking status: Never Smoker   . Smokeless tobacco: Former Neurosurgeon  . Alcohol Use: 0.6 oz/week    1 Glasses of wine per week     Comment: daily     Not on File  Current Outpatient Prescriptions  Medication Sig Dispense Refill  . senna-docusate (SENOKOT-S) 8.6-50 MG per tablet Take 1 tablet by mouth daily.       No current facility-administered medications for this visit.    Review of Systems Review of Systems  Constitutional: Unexpected weight change: Weight gain.  HENT: Positive for congestion.   Respiratory: Positive for cough.   Cardiovascular: Negative.   Gastrointestinal: Positive for abdominal pain and constipation.  Neurological: Positive for  headaches.    Blood pressure 148/88, pulse 65, temperature 97.7 F (36.5 C), temperature source Temporal, resp. rate 16, height 5' 7.5" (1.715 m), weight 211 lb (95.709 kg).  Physical Exam Physical Exam  Constitutional: No distress.  Overweight male.  HENT:  Head: Normocephalic and atraumatic.  Abdominal: Soft. He exhibits no distension and no mass. There is no tenderness.  Midline scar with weakness in the epigastric region the  Genitourinary:  Right groin scar with tenderness just superior to the scar. No evidence of hernia in the right inguinal area or left inguinal area. Only one testicle is present.    Data Reviewed Note from Dr. Pete Glatter. CT scan.  Assessment    Right lower quadrant/groin pain. This is, however since he has gained a substantial amount of weight. It likely is pain from the previous hernia repair and tension is being placed on it by the increased weight in that area.     Plan    No surgical intervention is indicated. I've recommended he tried nonsteroidal anti-inflammatory agents. Also recommended that he try to be a little more active with respect to walking and decreases dietary intake some.        Clayson Riling J 03/01/2013, 1:56 PM

## 2013-03-01 NOTE — Patient Instructions (Signed)
Use Advil as needed for the pain.  Try to lose some weight.

## 2013-03-28 DIAGNOSIS — R1031 Right lower quadrant pain: Secondary | ICD-10-CM | POA: Diagnosis not present

## 2013-04-04 DIAGNOSIS — J069 Acute upper respiratory infection, unspecified: Secondary | ICD-10-CM | POA: Diagnosis not present

## 2013-06-06 DIAGNOSIS — H2510 Age-related nuclear cataract, unspecified eye: Secondary | ICD-10-CM | POA: Diagnosis not present

## 2013-09-15 DIAGNOSIS — J40 Bronchitis, not specified as acute or chronic: Secondary | ICD-10-CM | POA: Diagnosis not present

## 2013-11-02 DIAGNOSIS — Z1331 Encounter for screening for depression: Secondary | ICD-10-CM | POA: Diagnosis not present

## 2013-11-02 DIAGNOSIS — Z0000 Encounter for general adult medical examination without abnormal findings: Secondary | ICD-10-CM | POA: Diagnosis not present

## 2013-11-02 DIAGNOSIS — E669 Obesity, unspecified: Secondary | ICD-10-CM | POA: Diagnosis not present

## 2013-11-02 DIAGNOSIS — Z6830 Body mass index (BMI) 30.0-30.9, adult: Secondary | ICD-10-CM | POA: Diagnosis not present

## 2013-11-02 DIAGNOSIS — R030 Elevated blood-pressure reading, without diagnosis of hypertension: Secondary | ICD-10-CM | POA: Diagnosis not present

## 2013-11-30 DIAGNOSIS — R42 Dizziness and giddiness: Secondary | ICD-10-CM | POA: Diagnosis not present

## 2013-11-30 DIAGNOSIS — F430 Acute stress reaction: Secondary | ICD-10-CM | POA: Diagnosis not present

## 2013-11-30 DIAGNOSIS — E669 Obesity, unspecified: Secondary | ICD-10-CM | POA: Diagnosis not present

## 2013-11-30 DIAGNOSIS — Z6830 Body mass index (BMI) 30.0-30.9, adult: Secondary | ICD-10-CM | POA: Diagnosis not present

## 2013-11-30 DIAGNOSIS — Z23 Encounter for immunization: Secondary | ICD-10-CM | POA: Diagnosis not present

## 2013-12-19 DIAGNOSIS — D1720 Benign lipomatous neoplasm of skin and subcutaneous tissue of unspecified limb: Secondary | ICD-10-CM | POA: Diagnosis not present

## 2013-12-19 DIAGNOSIS — D239 Other benign neoplasm of skin, unspecified: Secondary | ICD-10-CM | POA: Diagnosis not present

## 2013-12-19 DIAGNOSIS — L814 Other melanin hyperpigmentation: Secondary | ICD-10-CM | POA: Diagnosis not present

## 2013-12-19 DIAGNOSIS — L821 Other seborrheic keratosis: Secondary | ICD-10-CM | POA: Diagnosis not present

## 2013-12-19 DIAGNOSIS — Z85828 Personal history of other malignant neoplasm of skin: Secondary | ICD-10-CM | POA: Diagnosis not present

## 2013-12-19 DIAGNOSIS — Z86018 Personal history of other benign neoplasm: Secondary | ICD-10-CM | POA: Diagnosis not present

## 2013-12-19 DIAGNOSIS — Z85820 Personal history of malignant melanoma of skin: Secondary | ICD-10-CM | POA: Diagnosis not present

## 2013-12-27 ENCOUNTER — Ambulatory Visit
Admission: RE | Admit: 2013-12-27 | Discharge: 2013-12-27 | Disposition: A | Discharge status: Home / Self Care | Payer: Medicare Other | Source: Ambulatory Visit | Attending: Nurse Practitioner | Admitting: Nurse Practitioner

## 2013-12-27 ENCOUNTER — Other Ambulatory Visit: Payer: Self-pay | Admitting: Nurse Practitioner

## 2013-12-27 DIAGNOSIS — R0989 Other specified symptoms and signs involving the circulatory and respiratory systems: Secondary | ICD-10-CM | POA: Diagnosis not present

## 2013-12-27 DIAGNOSIS — W19XXXA Unspecified fall, initial encounter: Secondary | ICD-10-CM | POA: Diagnosis not present

## 2013-12-27 DIAGNOSIS — J209 Acute bronchitis, unspecified: Secondary | ICD-10-CM | POA: Diagnosis not present

## 2013-12-27 DIAGNOSIS — T1490 Injury, unspecified: Secondary | ICD-10-CM | POA: Diagnosis not present

## 2013-12-27 DIAGNOSIS — R05 Cough: Secondary | ICD-10-CM | POA: Diagnosis not present

## 2014-01-04 DIAGNOSIS — J309 Allergic rhinitis, unspecified: Secondary | ICD-10-CM | POA: Diagnosis not present

## 2014-03-23 ENCOUNTER — Other Ambulatory Visit: Payer: Self-pay | Admitting: Internal Medicine

## 2014-03-23 ENCOUNTER — Ambulatory Visit
Admission: RE | Admit: 2014-03-23 | Discharge: 2014-03-23 | Disposition: A | Discharge status: Home / Self Care | Payer: Medicare Other | Source: Ambulatory Visit | Attending: Internal Medicine | Admitting: Internal Medicine

## 2014-03-23 DIAGNOSIS — I10 Essential (primary) hypertension: Secondary | ICD-10-CM | POA: Diagnosis not present

## 2014-03-23 DIAGNOSIS — M898X1 Other specified disorders of bone, shoulder: Secondary | ICD-10-CM | POA: Diagnosis not present

## 2014-03-23 DIAGNOSIS — M19011 Primary osteoarthritis, right shoulder: Secondary | ICD-10-CM | POA: Diagnosis not present

## 2014-03-24 ENCOUNTER — Ambulatory Visit (INDEPENDENT_AMBULATORY_CARE_PROVIDER_SITE_OTHER): Payer: Medicare Other | Admitting: Podiatrist

## 2014-03-24 ENCOUNTER — Encounter: Payer: Self-pay | Admitting: Podiatrist

## 2014-03-24 VITALS — BP 184/107 | HR 94 | Resp 16

## 2014-03-24 DIAGNOSIS — L600 Ingrowing nail: Secondary | ICD-10-CM

## 2014-03-30 NOTE — Progress Notes (Signed)
Chief Complaint  Patient presents with  . Ingrown Toenail    1st toe left - medial   "I have an ingrown toenail"     HPI: Patient is 81 y.o. male who presents today for painful toenail that feels ingrown on the left great toe. He's tried trimming it himself however he states that it continues to become painful and symptomatic.   No Known Allergies  Physical Exam  Patient is awake, alert, and oriented x 3.  In no acute distress.  Vascular status is intact with palpable pedal pulses at 1/4 DP and PT bilateral and capillary refill time within normal limits. Neurological sensation is also intact bilaterally via Semmes Weinstein monofilament at 5/5 sites. Light touch, vibratory sensation, Achilles tendon reflex is intact. Dermatological exam reveals skin color, turger and texture as normal. No open lesions present.  Musculature intact with dorsiflexion, plantarflexion, inversion, eversion.  Left great toenail is incurvated and symptomatic. No redness, no swelling, no sign of infection is noted. Mild ingrown deformity is noted.  Assessment: Ingrown toenail left first medial border  Plan: Recommended a deep debridement to see if this would help the nail and if it does not we will consider a more aggressive procedure. The patient did agree and this was complicated today without complication. He will call if this is not beneficial otherwise he'll be seen back as needed or for routine care in the future.

## 2014-04-13 DIAGNOSIS — I10 Essential (primary) hypertension: Secondary | ICD-10-CM | POA: Diagnosis not present

## 2014-04-15 DIAGNOSIS — M5090 Cervical disc disorder, unspecified, unspecified cervical region: Secondary | ICD-10-CM | POA: Diagnosis not present

## 2014-04-18 DIAGNOSIS — H2513 Age-related nuclear cataract, bilateral: Secondary | ICD-10-CM | POA: Diagnosis not present

## 2014-04-26 DIAGNOSIS — I10 Essential (primary) hypertension: Secondary | ICD-10-CM | POA: Diagnosis not present

## 2014-04-28 DIAGNOSIS — M5090 Cervical disc disorder, unspecified, unspecified cervical region: Secondary | ICD-10-CM | POA: Diagnosis not present

## 2014-05-20 DIAGNOSIS — M5090 Cervical disc disorder, unspecified, unspecified cervical region: Secondary | ICD-10-CM | POA: Diagnosis not present

## 2014-05-24 ENCOUNTER — Other Ambulatory Visit: Payer: Self-pay | Admitting: Internal Medicine

## 2014-05-24 DIAGNOSIS — N644 Mastodynia: Secondary | ICD-10-CM | POA: Diagnosis not present

## 2014-05-30 ENCOUNTER — Ambulatory Visit
Admission: RE | Admit: 2014-05-30 | Discharge: 2014-05-30 | Disposition: A | Discharge status: Home / Self Care | Payer: Medicare Other | Source: Ambulatory Visit | Attending: Internal Medicine | Admitting: Internal Medicine

## 2014-05-30 DIAGNOSIS — N644 Mastodynia: Secondary | ICD-10-CM

## 2014-05-30 DIAGNOSIS — N62 Hypertrophy of breast: Secondary | ICD-10-CM | POA: Diagnosis not present

## 2014-06-06 DIAGNOSIS — R05 Cough: Secondary | ICD-10-CM | POA: Diagnosis not present

## 2014-06-06 DIAGNOSIS — R0789 Other chest pain: Secondary | ICD-10-CM | POA: Diagnosis not present

## 2014-06-06 DIAGNOSIS — R51 Headache: Secondary | ICD-10-CM | POA: Diagnosis not present

## 2014-06-06 DIAGNOSIS — K219 Gastro-esophageal reflux disease without esophagitis: Secondary | ICD-10-CM | POA: Diagnosis not present

## 2014-08-15 DIAGNOSIS — K219 Gastro-esophageal reflux disease without esophagitis: Secondary | ICD-10-CM | POA: Diagnosis not present

## 2014-08-15 DIAGNOSIS — E669 Obesity, unspecified: Secondary | ICD-10-CM | POA: Diagnosis not present

## 2014-08-15 DIAGNOSIS — I129 Hypertensive chronic kidney disease with stage 1 through stage 4 chronic kidney disease, or unspecified chronic kidney disease: Secondary | ICD-10-CM | POA: Diagnosis not present

## 2014-08-15 DIAGNOSIS — Z6832 Body mass index (BMI) 32.0-32.9, adult: Secondary | ICD-10-CM | POA: Diagnosis not present

## 2014-08-15 DIAGNOSIS — N183 Chronic kidney disease, stage 3 (moderate): Secondary | ICD-10-CM | POA: Diagnosis not present

## 2014-10-18 DIAGNOSIS — H2513 Age-related nuclear cataract, bilateral: Secondary | ICD-10-CM | POA: Diagnosis not present

## 2014-12-06 DIAGNOSIS — Z23 Encounter for immunization: Secondary | ICD-10-CM | POA: Diagnosis not present

## 2014-12-06 DIAGNOSIS — Z79899 Other long term (current) drug therapy: Secondary | ICD-10-CM | POA: Diagnosis not present

## 2014-12-06 DIAGNOSIS — F3340 Major depressive disorder, recurrent, in remission, unspecified: Secondary | ICD-10-CM | POA: Diagnosis not present

## 2014-12-06 DIAGNOSIS — I129 Hypertensive chronic kidney disease with stage 1 through stage 4 chronic kidney disease, or unspecified chronic kidney disease: Secondary | ICD-10-CM | POA: Diagnosis not present

## 2014-12-06 DIAGNOSIS — Z6832 Body mass index (BMI) 32.0-32.9, adult: Secondary | ICD-10-CM | POA: Diagnosis not present

## 2014-12-06 DIAGNOSIS — Z0000 Encounter for general adult medical examination without abnormal findings: Secondary | ICD-10-CM | POA: Diagnosis not present

## 2014-12-06 DIAGNOSIS — E669 Obesity, unspecified: Secondary | ICD-10-CM | POA: Diagnosis not present

## 2014-12-06 DIAGNOSIS — N183 Chronic kidney disease, stage 3 (moderate): Secondary | ICD-10-CM | POA: Diagnosis not present

## 2014-12-15 DIAGNOSIS — L814 Other melanin hyperpigmentation: Secondary | ICD-10-CM | POA: Diagnosis not present

## 2014-12-15 DIAGNOSIS — L570 Actinic keratosis: Secondary | ICD-10-CM | POA: Diagnosis not present

## 2014-12-15 DIAGNOSIS — L578 Other skin changes due to chronic exposure to nonionizing radiation: Secondary | ICD-10-CM | POA: Diagnosis not present

## 2014-12-15 DIAGNOSIS — L820 Inflamed seborrheic keratosis: Secondary | ICD-10-CM | POA: Diagnosis not present

## 2015-03-06 DIAGNOSIS — D0439 Carcinoma in situ of skin of other parts of face: Secondary | ICD-10-CM | POA: Diagnosis not present

## 2015-03-06 DIAGNOSIS — L821 Other seborrheic keratosis: Secondary | ICD-10-CM | POA: Diagnosis not present

## 2015-03-06 DIAGNOSIS — L853 Xerosis cutis: Secondary | ICD-10-CM | POA: Diagnosis not present

## 2015-03-06 DIAGNOSIS — Z85828 Personal history of other malignant neoplasm of skin: Secondary | ICD-10-CM | POA: Diagnosis not present

## 2015-03-06 DIAGNOSIS — Z23 Encounter for immunization: Secondary | ICD-10-CM | POA: Diagnosis not present

## 2015-03-06 DIAGNOSIS — D1721 Benign lipomatous neoplasm of skin and subcutaneous tissue of right arm: Secondary | ICD-10-CM | POA: Diagnosis not present

## 2015-03-06 DIAGNOSIS — Z87898 Personal history of other specified conditions: Secondary | ICD-10-CM | POA: Diagnosis not present

## 2015-03-06 DIAGNOSIS — Z86018 Personal history of other benign neoplasm: Secondary | ICD-10-CM | POA: Diagnosis not present

## 2015-03-06 DIAGNOSIS — D485 Neoplasm of uncertain behavior of skin: Secondary | ICD-10-CM | POA: Diagnosis not present

## 2015-04-10 DIAGNOSIS — Z1211 Encounter for screening for malignant neoplasm of colon: Secondary | ICD-10-CM | POA: Diagnosis not present

## 2015-04-18 ENCOUNTER — Other Ambulatory Visit: Payer: Self-pay | Admitting: Geriatric Medicine

## 2015-04-18 ENCOUNTER — Ambulatory Visit
Admission: RE | Admit: 2015-04-18 | Discharge: 2015-04-18 | Disposition: A | Discharge status: Home / Self Care | Payer: Medicare Other | Source: Ambulatory Visit | Attending: Geriatric Medicine | Admitting: Geriatric Medicine

## 2015-04-18 DIAGNOSIS — R05 Cough: Secondary | ICD-10-CM | POA: Diagnosis not present

## 2015-04-18 DIAGNOSIS — R059 Cough, unspecified: Secondary | ICD-10-CM

## 2015-04-18 DIAGNOSIS — N183 Chronic kidney disease, stage 3 (moderate): Secondary | ICD-10-CM | POA: Diagnosis not present

## 2015-04-18 DIAGNOSIS — F3340 Major depressive disorder, recurrent, in remission, unspecified: Secondary | ICD-10-CM | POA: Diagnosis not present

## 2015-04-18 DIAGNOSIS — Z6833 Body mass index (BMI) 33.0-33.9, adult: Secondary | ICD-10-CM | POA: Diagnosis not present

## 2015-04-18 DIAGNOSIS — I129 Hypertensive chronic kidney disease with stage 1 through stage 4 chronic kidney disease, or unspecified chronic kidney disease: Secondary | ICD-10-CM | POA: Diagnosis not present

## 2015-04-18 DIAGNOSIS — E669 Obesity, unspecified: Secondary | ICD-10-CM | POA: Diagnosis not present

## 2015-04-18 DIAGNOSIS — R9389 Abnormal findings on diagnostic imaging of other specified body structures: Secondary | ICD-10-CM

## 2015-04-18 DIAGNOSIS — K219 Gastro-esophageal reflux disease without esophagitis: Secondary | ICD-10-CM | POA: Diagnosis not present

## 2015-04-20 ENCOUNTER — Ambulatory Visit
Admission: RE | Admit: 2015-04-20 | Discharge: 2015-04-20 | Disposition: A | Discharge status: Home / Self Care | Payer: Medicare Other | Source: Ambulatory Visit | Attending: Geriatric Medicine | Admitting: Geriatric Medicine

## 2015-04-20 DIAGNOSIS — R9389 Abnormal findings on diagnostic imaging of other specified body structures: Secondary | ICD-10-CM

## 2015-04-20 DIAGNOSIS — R918 Other nonspecific abnormal finding of lung field: Secondary | ICD-10-CM | POA: Diagnosis not present

## 2015-04-25 DIAGNOSIS — H348112 Central retinal vein occlusion, right eye, stable: Secondary | ICD-10-CM | POA: Diagnosis not present

## 2015-04-25 DIAGNOSIS — T1512XA Foreign body in conjunctival sac, left eye, initial encounter: Secondary | ICD-10-CM | POA: Diagnosis not present

## 2015-04-25 DIAGNOSIS — H2513 Age-related nuclear cataract, bilateral: Secondary | ICD-10-CM | POA: Diagnosis not present

## 2015-05-03 DIAGNOSIS — R05 Cough: Secondary | ICD-10-CM | POA: Diagnosis not present

## 2015-05-30 DIAGNOSIS — D099 Carcinoma in situ, unspecified: Secondary | ICD-10-CM | POA: Diagnosis not present

## 2015-05-30 DIAGNOSIS — L820 Inflamed seborrheic keratosis: Secondary | ICD-10-CM | POA: Diagnosis not present

## 2015-06-05 DIAGNOSIS — Z6833 Body mass index (BMI) 33.0-33.9, adult: Secondary | ICD-10-CM | POA: Diagnosis not present

## 2015-06-05 DIAGNOSIS — I129 Hypertensive chronic kidney disease with stage 1 through stage 4 chronic kidney disease, or unspecified chronic kidney disease: Secondary | ICD-10-CM | POA: Diagnosis not present

## 2015-06-05 DIAGNOSIS — E669 Obesity, unspecified: Secondary | ICD-10-CM | POA: Diagnosis not present

## 2015-06-05 DIAGNOSIS — N183 Chronic kidney disease, stage 3 (moderate): Secondary | ICD-10-CM | POA: Diagnosis not present

## 2015-08-21 DIAGNOSIS — Z87898 Personal history of other specified conditions: Secondary | ICD-10-CM | POA: Diagnosis not present

## 2015-08-21 DIAGNOSIS — Z85828 Personal history of other malignant neoplasm of skin: Secondary | ICD-10-CM | POA: Diagnosis not present

## 2015-08-21 DIAGNOSIS — L820 Inflamed seborrheic keratosis: Secondary | ICD-10-CM | POA: Diagnosis not present

## 2015-08-21 DIAGNOSIS — D1721 Benign lipomatous neoplasm of skin and subcutaneous tissue of right arm: Secondary | ICD-10-CM | POA: Diagnosis not present

## 2015-10-05 DIAGNOSIS — L820 Inflamed seborrheic keratosis: Secondary | ICD-10-CM | POA: Diagnosis not present

## 2015-10-05 DIAGNOSIS — L814 Other melanin hyperpigmentation: Secondary | ICD-10-CM | POA: Diagnosis not present

## 2015-11-02 DIAGNOSIS — H40013 Open angle with borderline findings, low risk, bilateral: Secondary | ICD-10-CM | POA: Diagnosis not present

## 2015-12-20 DIAGNOSIS — R269 Unspecified abnormalities of gait and mobility: Secondary | ICD-10-CM | POA: Diagnosis not present

## 2015-12-20 DIAGNOSIS — Z79899 Other long term (current) drug therapy: Secondary | ICD-10-CM | POA: Diagnosis not present

## 2015-12-20 DIAGNOSIS — N183 Chronic kidney disease, stage 3 (moderate): Secondary | ICD-10-CM | POA: Diagnosis not present

## 2015-12-20 DIAGNOSIS — E669 Obesity, unspecified: Secondary | ICD-10-CM | POA: Diagnosis not present

## 2015-12-20 DIAGNOSIS — I700 Atherosclerosis of aorta: Secondary | ICD-10-CM | POA: Diagnosis not present

## 2015-12-20 DIAGNOSIS — F3340 Major depressive disorder, recurrent, in remission, unspecified: Secondary | ICD-10-CM | POA: Diagnosis not present

## 2015-12-20 DIAGNOSIS — I129 Hypertensive chronic kidney disease with stage 1 through stage 4 chronic kidney disease, or unspecified chronic kidney disease: Secondary | ICD-10-CM | POA: Diagnosis not present

## 2015-12-20 DIAGNOSIS — Z6834 Body mass index (BMI) 34.0-34.9, adult: Secondary | ICD-10-CM | POA: Diagnosis not present

## 2015-12-20 DIAGNOSIS — Z1389 Encounter for screening for other disorder: Secondary | ICD-10-CM | POA: Diagnosis not present

## 2015-12-20 DIAGNOSIS — K219 Gastro-esophageal reflux disease without esophagitis: Secondary | ICD-10-CM | POA: Diagnosis not present

## 2015-12-20 DIAGNOSIS — Z0000 Encounter for general adult medical examination without abnormal findings: Secondary | ICD-10-CM | POA: Diagnosis not present

## 2016-01-01 DIAGNOSIS — L570 Actinic keratosis: Secondary | ICD-10-CM | POA: Diagnosis not present

## 2016-01-01 DIAGNOSIS — L820 Inflamed seborrheic keratosis: Secondary | ICD-10-CM | POA: Diagnosis not present

## 2016-01-01 DIAGNOSIS — Z85820 Personal history of malignant melanoma of skin: Secondary | ICD-10-CM | POA: Diagnosis not present

## 2016-01-01 DIAGNOSIS — L821 Other seborrheic keratosis: Secondary | ICD-10-CM | POA: Diagnosis not present

## 2016-01-01 DIAGNOSIS — M6281 Muscle weakness (generalized): Secondary | ICD-10-CM | POA: Diagnosis not present

## 2016-01-01 DIAGNOSIS — R2681 Unsteadiness on feet: Secondary | ICD-10-CM | POA: Diagnosis not present

## 2016-01-03 DIAGNOSIS — M6281 Muscle weakness (generalized): Secondary | ICD-10-CM | POA: Diagnosis not present

## 2016-01-03 DIAGNOSIS — R2681 Unsteadiness on feet: Secondary | ICD-10-CM | POA: Diagnosis not present

## 2016-01-07 DIAGNOSIS — M6281 Muscle weakness (generalized): Secondary | ICD-10-CM | POA: Diagnosis not present

## 2016-01-07 DIAGNOSIS — R2681 Unsteadiness on feet: Secondary | ICD-10-CM | POA: Diagnosis not present

## 2016-01-09 DIAGNOSIS — R2681 Unsteadiness on feet: Secondary | ICD-10-CM | POA: Diagnosis not present

## 2016-01-09 DIAGNOSIS — M6281 Muscle weakness (generalized): Secondary | ICD-10-CM | POA: Diagnosis not present

## 2016-01-11 DIAGNOSIS — R2681 Unsteadiness on feet: Secondary | ICD-10-CM | POA: Diagnosis not present

## 2016-01-11 DIAGNOSIS — M6281 Muscle weakness (generalized): Secondary | ICD-10-CM | POA: Diagnosis not present

## 2016-02-01 DIAGNOSIS — H2513 Age-related nuclear cataract, bilateral: Secondary | ICD-10-CM | POA: Diagnosis not present

## 2016-03-05 DIAGNOSIS — D1721 Benign lipomatous neoplasm of skin and subcutaneous tissue of right arm: Secondary | ICD-10-CM | POA: Diagnosis not present

## 2016-03-05 DIAGNOSIS — Z87898 Personal history of other specified conditions: Secondary | ICD-10-CM | POA: Diagnosis not present

## 2016-03-05 DIAGNOSIS — L820 Inflamed seborrheic keratosis: Secondary | ICD-10-CM | POA: Diagnosis not present

## 2016-03-05 DIAGNOSIS — Z86018 Personal history of other benign neoplasm: Secondary | ICD-10-CM | POA: Diagnosis not present

## 2016-03-05 DIAGNOSIS — Z85828 Personal history of other malignant neoplasm of skin: Secondary | ICD-10-CM | POA: Diagnosis not present

## 2016-03-05 DIAGNOSIS — L821 Other seborrheic keratosis: Secondary | ICD-10-CM | POA: Diagnosis not present

## 2016-03-05 DIAGNOSIS — L308 Other specified dermatitis: Secondary | ICD-10-CM | POA: Diagnosis not present

## 2016-03-05 DIAGNOSIS — L817 Pigmented purpuric dermatosis: Secondary | ICD-10-CM | POA: Diagnosis not present

## 2016-03-05 DIAGNOSIS — Z23 Encounter for immunization: Secondary | ICD-10-CM | POA: Diagnosis not present

## 2016-04-05 DIAGNOSIS — J189 Pneumonia, unspecified organism: Secondary | ICD-10-CM | POA: Diagnosis not present

## 2016-04-17 ENCOUNTER — Ambulatory Visit
Admission: RE | Admit: 2016-04-17 | Discharge: 2016-04-17 | Disposition: A | Discharge status: Home / Self Care | Payer: Medicare Other | Source: Ambulatory Visit | Attending: Geriatric Medicine | Admitting: Geriatric Medicine

## 2016-04-17 ENCOUNTER — Other Ambulatory Visit: Payer: Self-pay | Admitting: Geriatric Medicine

## 2016-04-17 DIAGNOSIS — I129 Hypertensive chronic kidney disease with stage 1 through stage 4 chronic kidney disease, or unspecified chronic kidney disease: Secondary | ICD-10-CM | POA: Diagnosis not present

## 2016-04-17 DIAGNOSIS — I700 Atherosclerosis of aorta: Secondary | ICD-10-CM | POA: Diagnosis not present

## 2016-04-17 DIAGNOSIS — J189 Pneumonia, unspecified organism: Secondary | ICD-10-CM

## 2016-04-17 DIAGNOSIS — N183 Chronic kidney disease, stage 3 (moderate): Secondary | ICD-10-CM | POA: Diagnosis not present

## 2016-05-01 ENCOUNTER — Other Ambulatory Visit: Payer: Self-pay | Admitting: Geriatric Medicine

## 2016-05-01 ENCOUNTER — Ambulatory Visit
Admission: RE | Admit: 2016-05-01 | Discharge: 2016-05-01 | Disposition: A | Discharge status: Home / Self Care | Payer: Medicare Other | Source: Ambulatory Visit | Attending: Geriatric Medicine | Admitting: Geriatric Medicine

## 2016-05-01 DIAGNOSIS — J189 Pneumonia, unspecified organism: Secondary | ICD-10-CM | POA: Diagnosis not present

## 2016-06-18 DIAGNOSIS — E669 Obesity, unspecified: Secondary | ICD-10-CM | POA: Diagnosis not present

## 2016-06-18 DIAGNOSIS — I129 Hypertensive chronic kidney disease with stage 1 through stage 4 chronic kidney disease, or unspecified chronic kidney disease: Secondary | ICD-10-CM | POA: Diagnosis not present

## 2016-06-18 DIAGNOSIS — Z6834 Body mass index (BMI) 34.0-34.9, adult: Secondary | ICD-10-CM | POA: Diagnosis not present

## 2016-06-18 DIAGNOSIS — N183 Chronic kidney disease, stage 3 (moderate): Secondary | ICD-10-CM | POA: Diagnosis not present

## 2016-07-16 ENCOUNTER — Ambulatory Visit
Admission: RE | Admit: 2016-07-16 | Discharge: 2016-07-16 | Disposition: A | Discharge status: Home / Self Care | Payer: Medicare Other | Source: Ambulatory Visit | Attending: Geriatric Medicine | Admitting: Geriatric Medicine

## 2016-07-16 ENCOUNTER — Other Ambulatory Visit: Payer: Self-pay | Admitting: Geriatric Medicine

## 2016-07-16 DIAGNOSIS — R0789 Other chest pain: Secondary | ICD-10-CM | POA: Diagnosis not present

## 2016-07-16 DIAGNOSIS — N183 Chronic kidney disease, stage 3 (moderate): Secondary | ICD-10-CM | POA: Diagnosis not present

## 2016-07-16 DIAGNOSIS — R0781 Pleurodynia: Secondary | ICD-10-CM | POA: Diagnosis not present

## 2016-07-16 DIAGNOSIS — I129 Hypertensive chronic kidney disease with stage 1 through stage 4 chronic kidney disease, or unspecified chronic kidney disease: Secondary | ICD-10-CM | POA: Diagnosis not present

## 2016-07-16 DIAGNOSIS — R05 Cough: Secondary | ICD-10-CM | POA: Diagnosis not present

## 2016-07-31 DIAGNOSIS — T1512XA Foreign body in conjunctival sac, left eye, initial encounter: Secondary | ICD-10-CM | POA: Diagnosis not present

## 2016-07-31 DIAGNOSIS — H2513 Age-related nuclear cataract, bilateral: Secondary | ICD-10-CM | POA: Diagnosis not present

## 2016-08-05 ENCOUNTER — Encounter (HOSPITAL_COMMUNITY): Payer: Self-pay | Admitting: Emergency Medicine

## 2016-08-05 ENCOUNTER — Emergency Department (HOSPITAL_COMMUNITY)
Admission: EM | Admit: 2016-08-05 | Discharge: 2016-08-06 | Disposition: A | Discharge status: Home / Self Care | Payer: Medicare Other | Attending: Emergency Medicine | Admitting: Emergency Medicine

## 2016-08-05 DIAGNOSIS — I951 Orthostatic hypotension: Secondary | ICD-10-CM | POA: Diagnosis not present

## 2016-08-05 DIAGNOSIS — R404 Transient alteration of awareness: Secondary | ICD-10-CM | POA: Diagnosis not present

## 2016-08-05 DIAGNOSIS — Z87891 Personal history of nicotine dependence: Secondary | ICD-10-CM | POA: Insufficient documentation

## 2016-08-05 DIAGNOSIS — R42 Dizziness and giddiness: Secondary | ICD-10-CM | POA: Diagnosis not present

## 2016-08-05 DIAGNOSIS — Z85038 Personal history of other malignant neoplasm of large intestine: Secondary | ICD-10-CM | POA: Insufficient documentation

## 2016-08-05 DIAGNOSIS — H8110 Benign paroxysmal vertigo, unspecified ear: Secondary | ICD-10-CM | POA: Insufficient documentation

## 2016-08-05 LAB — URINALYSIS, ROUTINE W REFLEX MICROSCOPIC
BILIRUBIN URINE: NEGATIVE
GLUCOSE, UA: NEGATIVE mg/dL
HGB URINE DIPSTICK: NEGATIVE
KETONES UR: NEGATIVE mg/dL
Leukocytes, UA: NEGATIVE
Nitrite: NEGATIVE
PH: 7 (ref 5.0–8.0)
Protein, ur: NEGATIVE mg/dL
Specific Gravity, Urine: 1.014 (ref 1.005–1.030)

## 2016-08-05 LAB — BASIC METABOLIC PANEL
Anion gap: 8 (ref 5–15)
BUN: 24 mg/dL — ABNORMAL HIGH (ref 6–20)
CO2: 24 mmol/L (ref 22–32)
CREATININE: 1.31 mg/dL — AB (ref 0.61–1.24)
Calcium: 9.5 mg/dL (ref 8.9–10.3)
Chloride: 108 mmol/L (ref 101–111)
GFR calc Af Amer: 51 mL/min — ABNORMAL LOW (ref >60)
GFR calc non Af Amer: 44 mL/min — ABNORMAL LOW (ref >60)
GLUCOSE: 117 mg/dL — AB (ref 65–99)
POTASSIUM: 3.8 mmol/L (ref 3.5–5.1)
SODIUM: 140 mmol/L (ref 135–145)

## 2016-08-05 LAB — CBC
HEMATOCRIT: 43.1 % (ref 39.0–52.0)
Hemoglobin: 14.3 g/dL (ref 13.0–17.0)
MCH: 32.1 pg (ref 26.0–34.0)
MCHC: 33.2 g/dL (ref 30.0–36.0)
MCV: 96.6 fL (ref 78.0–100.0)
PLATELETS: 138 10*3/uL — AB (ref 150–400)
RBC: 4.46 MIL/uL (ref 4.22–5.81)
RDW: 13.8 % (ref 11.5–15.5)
WBC: 5.7 10*3/uL (ref 4.0–10.5)

## 2016-08-05 LAB — CBG MONITORING, ED: Glucose-Capillary: 122 mg/dL — ABNORMAL HIGH (ref 65–99)

## 2016-08-05 MED ORDER — MECLIZINE HCL 25 MG PO TABS
50.0000 mg | ORAL_TABLET | Freq: Once | ORAL | Status: AC
  Administered 2016-08-05: 50 mg via ORAL
  Filled 2016-08-05: qty 2

## 2016-08-05 MED ORDER — LORAZEPAM 2 MG/ML IJ SOLN
0.5000 mg | Freq: Once | INTRAMUSCULAR | Status: AC
  Administered 2016-08-05: 0.5 mg via INTRAVENOUS
  Filled 2016-08-05: qty 1

## 2016-08-05 NOTE — ED Notes (Signed)
Per MD, plan to walk patient at 0100 to assess presence of dizziness and then set disposition from there.

## 2016-08-05 NOTE — ED Notes (Signed)
ED Provider at bedside. 

## 2016-08-05 NOTE — ED Provider Notes (Addendum)
[facility] DEPT Provider Note   CSN: [id number] Arrival date & time: 08/05/16  2151     History   Chief Complaint Chief Complaint  Patient presents with  . Dizziness  . Nausea    HPI Ricky Nguyen is a 83 y.o. male.  HPI Pt with hx of HTN on amlodipine comes in with cc of dizziness. Pt reports that he was at his desk when suddenly he started having spinning sensation. Pt had associated diaphoresis. Pt didn't think he was going to faint. Pt had no associated nausea, numbness, tingling, weakness, blurry vision. Pt put his head down, and his symptoms didn't get better - so he called EMS. Pt reports that his symptoms lasted for 30 minutes or so and then got better. Pt has no hx of similar symptoms. Pt reports no new meds. Pt has had no trauma recently.  Past Medical History:  Diagnosis Date  . Bladder tumor   . BPH (benign prostatic hyperplasia)   . Cancer [facility])    Colon  . Depression     Patient Active Problem List   Diagnosis Date Noted  . Chronic RLQ pain 03/01/2013    Past Surgical History:  Procedure Laterality Date  . aortic ileac bypsss    . COLON SURGERY     Colorectal cancer  . HERNIA REPAIR     RIH repair/incarcerated  . TRANSURETHRAL RESECTION OF PROSTATE  06/2003       Home Medications    Prior to Admission medications   Medication Sig Start Date End Date Taking? Authorizing Provider  meclizine (ANTIVERT) 25 MG tablet Take 1 tablet (25 mg total) by mouth 3 (three) times daily as needed for dizziness. 08/06/16   Varney Biles, MD    Family History History reviewed. No pertinent family history.  Social History Social History  Substance Use Topics  . Smoking status: Never Smoker  . Smokeless tobacco: Former Systems developer  . Alcohol use 0.6 oz/week    1 Glasses of wine per week     Comment: daily      Allergies   Patient has no known allergies.   Review of Systems Review of Systems  Constitutional: Positive for activity change and  diaphoresis.  Respiratory: Negative for shortness of breath.   Cardiovascular: Negative for chest pain.  Gastrointestinal: Positive for nausea.  Neurological: Positive for dizziness.  All other systems reviewed and are negative.    Physical Exam Updated Vital Signs BP (!) 132/91   Pulse 87   Temp 97.5 F (36.4 C) (Oral)   Resp 16   Ht 5\' 7"  (1.702 m)   Wt 99.8 kg (220 lb)   SpO2 96%   BMI 34.46 kg/m   Physical Exam  Constitutional: He is oriented to person, place, and time. He appears well-developed.  HENT:  Head: Normocephalic and atraumatic.  Eyes: Conjunctivae and EOM are normal. Pupils are equal, round, and reactive to light.  No nystagmus  Neck: Normal range of motion. Neck supple.  Cardiovascular: Normal rate and regular rhythm.   Pulmonary/Chest: Effort normal and breath sounds normal.  Abdominal: Soft. Bowel sounds are normal. He exhibits no distension and no mass. There is no tenderness. There is no rebound and no guarding.  Musculoskeletal: He exhibits no deformity.  Neurological: He is alert and oriented to person, place, and time. No cranial nerve deficit. Coordination normal.  HINTS exam neg. Pt reports dizziness getting worse with HINTs exam  Cerebellar exam is normal (finger to nose) Sensory exam normal  for bilateral upper and lower extremities - and patient is able to discriminate between sharp and dull. Motor exam is 4+/5   Skin: Skin is warm.  Nursing note and vitals reviewed.    ED Treatments / Results  Labs (all labs ordered are listed, but only abnormal results are displayed) Labs Reviewed  BASIC METABOLIC PANEL - Abnormal; Notable for the following:       Result Value   Glucose, Bld 117 (*)    BUN 24 (*)    Creatinine, Ser 1.31 (*)    GFR calc non Af Amer 44 (*)    GFR calc Af Amer 51 (*)    All other components within normal limits  CBC - Abnormal; Notable for the following:    Platelets 138 (*)    All other components within normal  limits  CBG MONITORING, ED - Abnormal; Notable for the following:    Glucose-Capillary 122 (*)    All other components within normal limits  URINALYSIS, ROUTINE W REFLEX MICROSCOPIC    EKG  EKG Interpretation  Date/Time:  Tuesday Aug 05 2016 21:52:25 EDT Ventricular Rate:  74 PR Interval:    QRS Duration: 94 QT Interval:  395 QTC Calculation: 439 R Axis:   1 Text Interpretation:  Sinus rhythm Multiform ventricular premature complexes Low voltage, precordial leads Abnormal R-wave progression, early transition No acute changes Confirmed by Varney Biles [phone]) on 08/05/2016 10:43:42 PM       Radiology No results found.  Procedures Procedures (including critical care time)  Medications Ordered in ED Medications  meclizine (ANTIVERT) tablet 50 mg (50 mg Oral Given 08/05/16 2325)  LORazepam (ATIVAN) injection 0.5 mg (0.5 mg Intravenous Given 08/05/16 2325)     Initial Impression / Assessment and Plan / ED Course  I have reviewed the triage vital signs and the nursing notes.  Pertinent labs & imaging results that were available during my care of the patient were reviewed by me and considered in my medical decision making (see chart for details).  Clinical Course as of Aug 06 100  Tue Aug 05, 2016  2357 Pt reassessed. He is not having any vertigo like symptoms. I ambulated the patient, and he felt a little unsteady. Pt however had an ativan - so that could be the cause. Family wants to be sure. Plan is to reassess at 1 am. If pt is symptoms free and walks well, then he will be discharged. If unsteady - CT angio head and neck will be ordered. Dr. Leonides Schanz to follow.  [AN]  Wed Aug 06, 2016  0101 Ambulated w/o any unsteadiness or dizziness. No vertigo.  [AN]    Clinical Course User Index [AN] Varney Biles, MD    Pt comes in with cc of dizziness.  DDx includes: Central vertigo:  Tumor  Stroke  ICH  Vertebrobasilar TIA  Peripheral Vertigo:  BPPV  Vestibular  neuritis  Meniere disease  Migrainous vertigo  Ear Infection  Pt comes in with vertigo. It seems clinically that he has a peripheral vertigo, as his symptoms resolved, and now is  reproducible with movement. Pt's symptoms were severe when he had it, which is consistent with peripheral vertigo. However, pt is elderly and his initial symptoms lasted for 30 minutes -which is not common to see with peripheral vertigo  We will give ativan 0.5 mg and reassess. Worse case scenario - we will get CT angio head and neck, ensure the posterior circulation is normal and admit if the symptoms are  not in total control.    Final Clinical Impressions(s) / ED Diagnoses   Final diagnoses:  Vertigo  Benign paroxysmal positional vertigo, unspecified laterality  Orthostatic hypotension    New Prescriptions New Prescriptions   MECLIZINE (ANTIVERT) 25 MG TABLET    Take 1 tablet (25 mg total) by mouth 3 (three) times daily as needed for dizziness.     Varney Biles, MD 08/05/16 5284    Varney Biles, MD 08/06/16 0000    Varney Biles, MD 08/06/16 1324

## 2016-08-05 NOTE — ED Triage Notes (Signed)
Pt arrives from assisted living [employer] at [facility] with sudden onset dizziness , nausea and diaphoresis. Pt reports he was sitting at his desk when it occurred. Worse with standing. Positive orthostatics with EMS.

## 2016-08-06 MED ORDER — MECLIZINE HCL 25 MG PO TABS: 25.0000 mg | ORAL_TABLET | Freq: Three times a day (TID) | ORAL | 0 refills | Status: AC | PRN

## 2016-08-06 NOTE — ED Notes (Signed)
Ambulated patient down hallway and back to room, standby assist. Patient denies dizziness/nausea. MD notified.

## 2016-08-06 NOTE — ED Notes (Signed)
Patient verbalized understanding of discharge instructions and denies any further needs or questions at this time. VS stable. Patient ambulatory with steady gait. RN escorted to ED entrance in wheelchair.   

## 2016-08-06 NOTE — Discharge Instructions (Signed)
°  All the results in the ER are normal, labs and imaging. We suspect mild dehydration and peripheral vertigo. Hydrate well, take the meds prescribed and do the exercises listed under epley's maneuver in your discharge paperwork. See the ENT doctor as requested. Be careful with ambulation.  The workup in the ER is not complete, and is limited to screening for life threatening and emergent conditions only, so please see a primary care doctor for further evaluation.

## 2016-08-08 DIAGNOSIS — R42 Dizziness and giddiness: Secondary | ICD-10-CM | POA: Diagnosis not present

## 2016-08-20 DIAGNOSIS — J208 Acute bronchitis due to other specified organisms: Secondary | ICD-10-CM | POA: Diagnosis not present

## 2016-08-20 DIAGNOSIS — R05 Cough: Secondary | ICD-10-CM | POA: Diagnosis not present

## 2016-09-29 DIAGNOSIS — R310 Gross hematuria: Secondary | ICD-10-CM | POA: Diagnosis not present

## 2016-10-06 DIAGNOSIS — C678 Malignant neoplasm of overlapping sites of bladder: Secondary | ICD-10-CM | POA: Diagnosis not present

## 2016-10-06 DIAGNOSIS — R310 Gross hematuria: Secondary | ICD-10-CM | POA: Diagnosis not present

## 2016-10-14 DIAGNOSIS — R310 Gross hematuria: Secondary | ICD-10-CM | POA: Diagnosis not present

## 2016-10-14 DIAGNOSIS — C678 Malignant neoplasm of overlapping sites of bladder: Secondary | ICD-10-CM | POA: Diagnosis not present

## 2016-11-05 DIAGNOSIS — R310 Gross hematuria: Secondary | ICD-10-CM | POA: Diagnosis not present

## 2016-12-02 DIAGNOSIS — Z85820 Personal history of malignant melanoma of skin: Secondary | ICD-10-CM | POA: Diagnosis not present

## 2016-12-02 DIAGNOSIS — L821 Other seborrheic keratosis: Secondary | ICD-10-CM | POA: Diagnosis not present

## 2016-12-02 DIAGNOSIS — L814 Other melanin hyperpigmentation: Secondary | ICD-10-CM | POA: Diagnosis not present

## 2016-12-16 DIAGNOSIS — Z23 Encounter for immunization: Secondary | ICD-10-CM | POA: Diagnosis not present

## 2016-12-26 DIAGNOSIS — Z1389 Encounter for screening for other disorder: Secondary | ICD-10-CM | POA: Diagnosis not present

## 2016-12-26 DIAGNOSIS — Z79899 Other long term (current) drug therapy: Secondary | ICD-10-CM | POA: Diagnosis not present

## 2016-12-26 DIAGNOSIS — Z0000 Encounter for general adult medical examination without abnormal findings: Secondary | ICD-10-CM | POA: Diagnosis not present

## 2016-12-26 DIAGNOSIS — N183 Chronic kidney disease, stage 3 (moderate): Secondary | ICD-10-CM | POA: Diagnosis not present

## 2016-12-26 DIAGNOSIS — I129 Hypertensive chronic kidney disease with stage 1 through stage 4 chronic kidney disease, or unspecified chronic kidney disease: Secondary | ICD-10-CM | POA: Diagnosis not present

## 2017-01-15 ENCOUNTER — Ambulatory Visit (INDEPENDENT_AMBULATORY_CARE_PROVIDER_SITE_OTHER): Payer: Medicare Other | Admitting: Podiatry

## 2017-01-15 ENCOUNTER — Encounter: Payer: Self-pay | Admitting: Podiatry

## 2017-01-15 VITALS — BP 123/72

## 2017-01-15 DIAGNOSIS — M25559 Pain in unspecified hip: Secondary | ICD-10-CM | POA: Insufficient documentation

## 2017-01-15 DIAGNOSIS — K921 Melena: Secondary | ICD-10-CM | POA: Insufficient documentation

## 2017-01-15 DIAGNOSIS — G4700 Insomnia, unspecified: Secondary | ICD-10-CM | POA: Insufficient documentation

## 2017-01-15 DIAGNOSIS — L600 Ingrowing nail: Secondary | ICD-10-CM

## 2017-01-15 DIAGNOSIS — K5900 Constipation, unspecified: Secondary | ICD-10-CM | POA: Insufficient documentation

## 2017-01-15 DIAGNOSIS — M5020 Other cervical disc displacement, unspecified cervical region: Secondary | ICD-10-CM | POA: Insufficient documentation

## 2017-01-15 DIAGNOSIS — D494 Neoplasm of unspecified behavior of bladder: Secondary | ICD-10-CM | POA: Insufficient documentation

## 2017-01-15 DIAGNOSIS — C189 Malignant neoplasm of colon, unspecified: Secondary | ICD-10-CM | POA: Insufficient documentation

## 2017-01-15 DIAGNOSIS — F32A Depression, unspecified: Secondary | ICD-10-CM | POA: Insufficient documentation

## 2017-01-15 DIAGNOSIS — E669 Obesity, unspecified: Secondary | ICD-10-CM | POA: Insufficient documentation

## 2017-01-15 DIAGNOSIS — M5126 Other intervertebral disc displacement, lumbar region: Secondary | ICD-10-CM | POA: Insufficient documentation

## 2017-01-15 DIAGNOSIS — G629 Polyneuropathy, unspecified: Secondary | ICD-10-CM | POA: Insufficient documentation

## 2017-01-15 DIAGNOSIS — R933 Abnormal findings on diagnostic imaging of other parts of digestive tract: Secondary | ICD-10-CM | POA: Insufficient documentation

## 2017-01-15 DIAGNOSIS — M5430 Sciatica, unspecified side: Secondary | ICD-10-CM | POA: Insufficient documentation

## 2017-01-15 DIAGNOSIS — Z0000 Encounter for general adult medical examination without abnormal findings: Secondary | ICD-10-CM | POA: Insufficient documentation

## 2017-01-15 DIAGNOSIS — F329 Major depressive disorder, single episode, unspecified: Secondary | ICD-10-CM | POA: Insufficient documentation

## 2017-01-15 DIAGNOSIS — K625 Hemorrhage of anus and rectum: Secondary | ICD-10-CM | POA: Insufficient documentation

## 2017-01-15 DIAGNOSIS — M25519 Pain in unspecified shoulder: Secondary | ICD-10-CM | POA: Insufficient documentation

## 2017-01-15 DIAGNOSIS — D649 Anemia, unspecified: Secondary | ICD-10-CM | POA: Insufficient documentation

## 2017-01-15 NOTE — Progress Notes (Signed)
   Subjective:    Patient ID: Ricky Nguyen, male    DOB: 09/09/1916, 83 y.o.   MRN: [id number]  HPI  Chief Complaint  Patient presents with  . Nail Problem    left hallux, medial side, painful x "several months"    83 year old male presents with pain in the left great toe states that he has had issues with ingrown nails at this nail for several months.  States that this is an issue that periodically comes up.  Denies drainage from the toe   83 y.o. male presents with the above complaint.  Past Medical History:  Diagnosis Date  . Bladder tumor   . BPH (benign prostatic hyperplasia)   . Cancer [facility])    Colon  . Depression    Past Surgical History:  Procedure Laterality Date  . aortic ileac bypsss    . COLON SURGERY     Colorectal cancer  . HERNIA REPAIR     RIH repair/incarcerated  . TRANSURETHRAL RESECTION OF PROSTATE  06/2003    Current Outpatient Medications:  .  meclizine (ANTIVERT) 25 MG tablet, Take 1 tablet (25 mg total) by mouth 3 (three) times daily as needed for dizziness. (Patient not taking: Reported on 01/15/2017), Disp: 30 tablet, Rfl: 0  No Known Allergies  Review of Systems  All other systems reviewed and are negative.     Objective:   Physical Exam Vitals:   01/15/17 0935  BP: 123/72   General AA&O x3. Normal mood and affect.  Vascular Dorsalis pedis and posterior tibial pulses  present 1+ bilaterally  Capillary refill normal to all digits. Pedal hair growth diminished.  Neurologic Epicritic sensation present bilaterally.  Dermatologic No open lesions. Interspaces clear of maceration.  Normal skin temperature and turgor. Painful ingrowing nail at medial nail borders of the hallux nail left.  Orthopedic: MMT 5/5 in dorsiflexion, plantarflexion, inversion, and eversion. Normal lower extremity joint ROM without pain or crepitus.     Assessment & Plan:  Patient was evaluated and treated and all questions answered  Painful ingrowing nail left  great toe -Nail debrided in slant back fashion to patient relief -Educated on return precautions  Procedure: Nail Debridement  Rationale: painful ingrowing nail Type of Debridement: manual, sharp debridement. Instrumentation: Nail nipper  Number of Nails: 1    Return if symptoms worsen or fail to improve.

## 2017-01-19 ENCOUNTER — Encounter: Payer: Self-pay | Admitting: Podiatry

## 2017-01-27 DIAGNOSIS — H2513 Age-related nuclear cataract, bilateral: Secondary | ICD-10-CM | POA: Diagnosis not present

## 2017-02-25 ENCOUNTER — Ambulatory Visit: Payer: Medicare Other | Admitting: Registered"

## 2017-03-11 DIAGNOSIS — R109 Unspecified abdominal pain: Secondary | ICD-10-CM | POA: Diagnosis not present

## 2017-03-11 DIAGNOSIS — N183 Chronic kidney disease, stage 3 (moderate): Secondary | ICD-10-CM | POA: Diagnosis not present

## 2017-03-11 DIAGNOSIS — I129 Hypertensive chronic kidney disease with stage 1 through stage 4 chronic kidney disease, or unspecified chronic kidney disease: Secondary | ICD-10-CM | POA: Diagnosis not present

## 2017-04-10 ENCOUNTER — Other Ambulatory Visit: Payer: Self-pay | Admitting: Geriatric Medicine

## 2017-04-10 DIAGNOSIS — R1031 Right lower quadrant pain: Secondary | ICD-10-CM | POA: Diagnosis not present

## 2017-04-10 DIAGNOSIS — I129 Hypertensive chronic kidney disease with stage 1 through stage 4 chronic kidney disease, or unspecified chronic kidney disease: Secondary | ICD-10-CM | POA: Diagnosis not present

## 2017-04-10 DIAGNOSIS — N183 Chronic kidney disease, stage 3 (moderate): Secondary | ICD-10-CM | POA: Diagnosis not present

## 2017-04-10 DIAGNOSIS — Z79899 Other long term (current) drug therapy: Secondary | ICD-10-CM | POA: Diagnosis not present

## 2017-04-14 ENCOUNTER — Ambulatory Visit
Admission: RE | Admit: 2017-04-14 | Discharge: 2017-04-14 | Disposition: A | Discharge status: Home / Self Care | Payer: Medicare Other | Source: Ambulatory Visit | Attending: Geriatric Medicine | Admitting: Geriatric Medicine

## 2017-04-14 DIAGNOSIS — R1031 Right lower quadrant pain: Secondary | ICD-10-CM

## 2017-04-14 DIAGNOSIS — K5730 Diverticulosis of large intestine without perforation or abscess without bleeding: Secondary | ICD-10-CM | POA: Diagnosis not present

## 2017-04-14 MED ORDER — IOPAMIDOL (ISOVUE-300) INJECTION 61%
80.0000 mL | Freq: Once | INTRAVENOUS | Status: AC | PRN
  Administered 2017-04-14: 80 mL via INTRAVENOUS

## 2017-05-11 ENCOUNTER — Encounter: Payer: Self-pay | Admitting: Dietician

## 2017-05-11 ENCOUNTER — Encounter: Payer: Medicare Other | Attending: Geriatric Medicine | Admitting: Dietician

## 2017-05-11 DIAGNOSIS — Z713 Dietary counseling and surveillance: Secondary | ICD-10-CM | POA: Insufficient documentation

## 2017-05-11 DIAGNOSIS — I129 Hypertensive chronic kidney disease with stage 1 through stage 4 chronic kidney disease, or unspecified chronic kidney disease: Secondary | ICD-10-CM | POA: Diagnosis not present

## 2017-05-11 DIAGNOSIS — N189 Chronic kidney disease, unspecified: Secondary | ICD-10-CM | POA: Diagnosis not present

## 2017-05-11 DIAGNOSIS — E669 Obesity, unspecified: Secondary | ICD-10-CM | POA: Diagnosis not present

## 2017-05-11 DIAGNOSIS — N183 Chronic kidney disease, stage 3 unspecified: Secondary | ICD-10-CM

## 2017-05-11 DIAGNOSIS — Z6833 Body mass index (BMI) 33.0-33.9, adult: Secondary | ICD-10-CM

## 2017-05-11 NOTE — Patient Instructions (Addendum)
Stay active most days and especially when you are feeling well. Eat slowly and stop when you are satisfied. If you want a snack ask, "Am I still hungry or eating for another reason?"  If you still want a snack then take a small portion in a bowl, eat without distraction.   Consider calories that don't have much nutrition such as:  Wine  Desserts (ice cream, cookies) Have the above less frequently and in smaller portions.  Continue to avoid added salt. Consider changing diet pepsi to diet gingerale or sprite.

## 2017-05-11 NOTE — Progress Notes (Signed)
Medical Nutrition Therapy:  Appt start time: 1400 end time:  1435.   Assessment:  Primary concerns today: Patient is here today alone.  He states that he wants to lose weight.  Weight 220 lbs overall stable recently per patient with slow weight gain from 211 lbs 2014.  Patient brought a list of meals that he eats. Ice cream and other sweets daily as well as wine most days.  Discussed that certainly any dietary change is his choices.  He states that he often eats to the point of being very uncomfortable.  History includes bladder and colorectal cancer as well as depression. Referral was for Chronic Kidney Disease and obesity.     Patient lives alone in [facility] assisted living and eats one meal daily in the dining center.  He has a daughter in [place] that is very supportive and his youngest son is in [place].  Patient drives.  He is quite active for his age and uses a walker for safety only.      Preferred Learning Style:   No preference indicated   Learning Readiness:   Ready  MEDICATIONS: none   DIETARY INTAKE:  Usual eating pattern includes 3 meals and 1 snacks per day.  Everyday foods include ice cream or other sweets and wine most days.  Avoided foods include added salt.    24-hr recall:  B ( AM): Granola, 1% milk, banana, berries, coffee with milk, walnut raison cookies every morning Snk ( AM): none  L ( PM): shrimp salad, klondike bar, coffee with milk OR stuffed burrito, mandarin orange, coffee with milk OR 2 scrambled eggs with cheese, ice cream stick, coffee Snk ( PM): none D ( PM): chicken soup, mixed fruit cup, stuffed burrito, ice cream OR soup, salad, goulash OR soup, peaches, beef brisket Snk ( PM): wine, cheese, crackers Beverages:  Water, coffee with milk, diet pepsi, unsweetened tea, 2 glasses wine per day when he has it which is around 5/7 days.  Usual physical activity: walks purposefully and quite quickly with a walker for safety.  He goes around  the building 1-2 times per day indoors.  Estimated energy needs: 1600 calories 80 g protein  Progress Towards Goal(s):  In progress.   Nutritional Diagnosis:  NB-1.1 Food and nutrition-related knowledge deficit As related to CKD and obesity.  As evidenced by patient report and referral.    Intervention:  Nutrition education regarding continued avoidance of added salt due to the CKD.  His protein intake is not excessive.  Discussed nutrition quality of foods and that this is a priority.  No restrictions should be made at this time on those foods that currently are providing him increased nutrition and that discretionary calories are those without vitamins or other nutrients such as the sweets and wine.  Discussed idea of decreased frequency and decreased portion size.  Discussed mindful eating as well as pleasure from what we eat.  Stay active most days and especially when you are feeling well. Eat slowly and stop when you are satisfied. If you want a snack ask, "Am I still hungry or eating for another reason?"  If you still want a snack then take a small portion in a bowl, eat without distraction.   Consider calories that don't have much nutrition such as:  Wine  Desserts (ice cream, cookies) Have the above less frequently and in smaller portions.  Continue to avoid added salt. Consider changing diet pepsi to diet gingerale or sprite.   Teaching Method  Utilized:  International aid/development worker on  Handouts given during visit include:  Label reading  Barriers to learning/adherence to lifestyle change: none  Demonstrated degree of understanding via:  Teach Back   Monitoring/Evaluation:  Dietary intake, exercise, label reading, and body weight prn.

## 2017-05-14 DIAGNOSIS — R1031 Right lower quadrant pain: Secondary | ICD-10-CM | POA: Diagnosis not present

## 2017-06-01 DIAGNOSIS — I129 Hypertensive chronic kidney disease with stage 1 through stage 4 chronic kidney disease, or unspecified chronic kidney disease: Secondary | ICD-10-CM | POA: Diagnosis not present

## 2017-06-01 DIAGNOSIS — N183 Chronic kidney disease, stage 3 (moderate): Secondary | ICD-10-CM | POA: Diagnosis not present

## 2017-06-01 DIAGNOSIS — R1031 Right lower quadrant pain: Secondary | ICD-10-CM | POA: Diagnosis not present

## 2017-07-17 DIAGNOSIS — R55 Syncope and collapse: Secondary | ICD-10-CM | POA: Diagnosis not present

## 2017-08-28 DIAGNOSIS — H2513 Age-related nuclear cataract, bilateral: Secondary | ICD-10-CM | POA: Diagnosis not present

## 2017-09-18 IMAGING — CR DG CHEST 2V
2 series · 2 of 2 positions shown · non-contrast
Comparison: PA and lateral chest x-ray dated April 17, 2016 and
April 18, 2015.

CLINICAL DATA: City episode of pneumonia. Completed antibiotic
therapy. Improved symptoms.

EXAM:
CHEST  2 VIEW

[w chest pa]
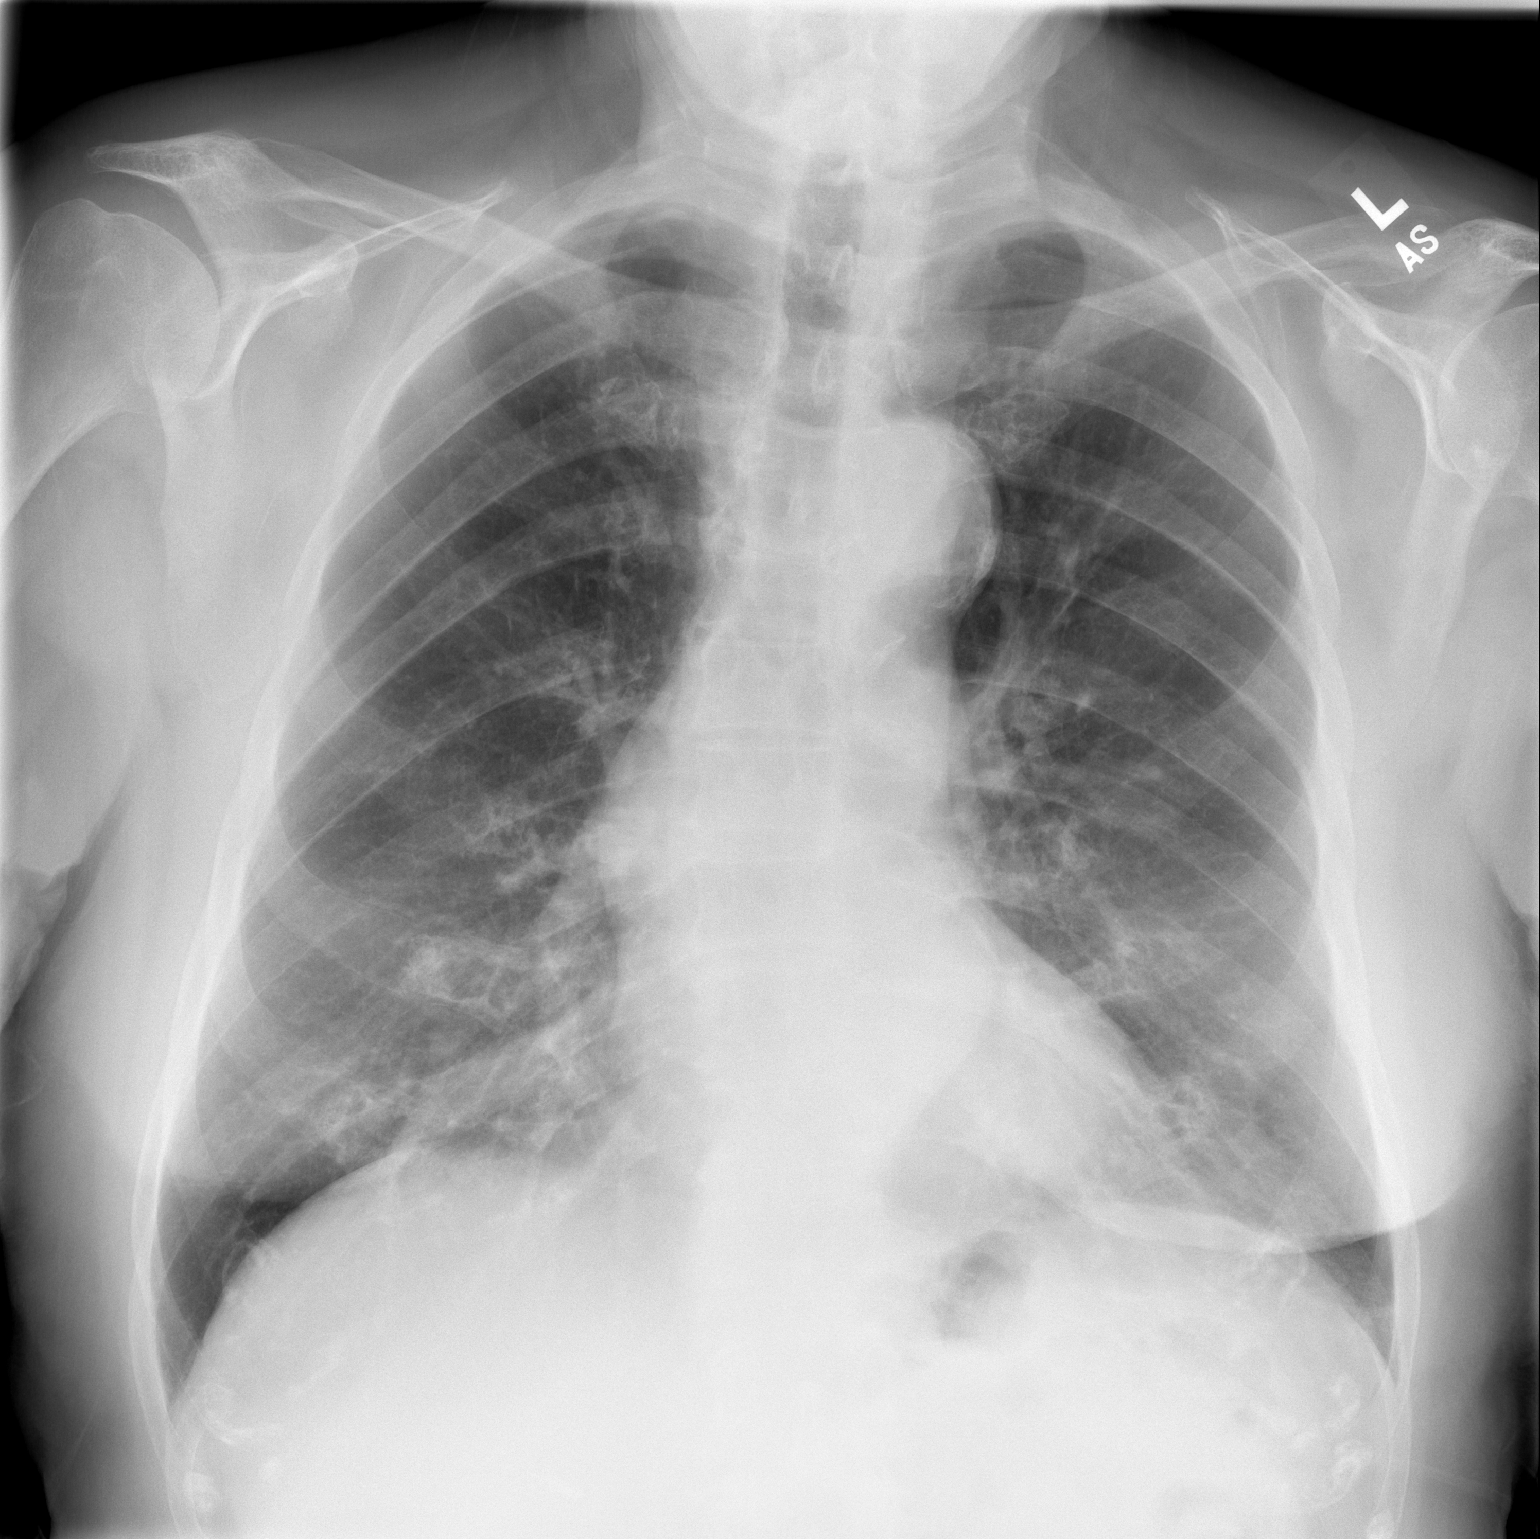

[w chest lat]
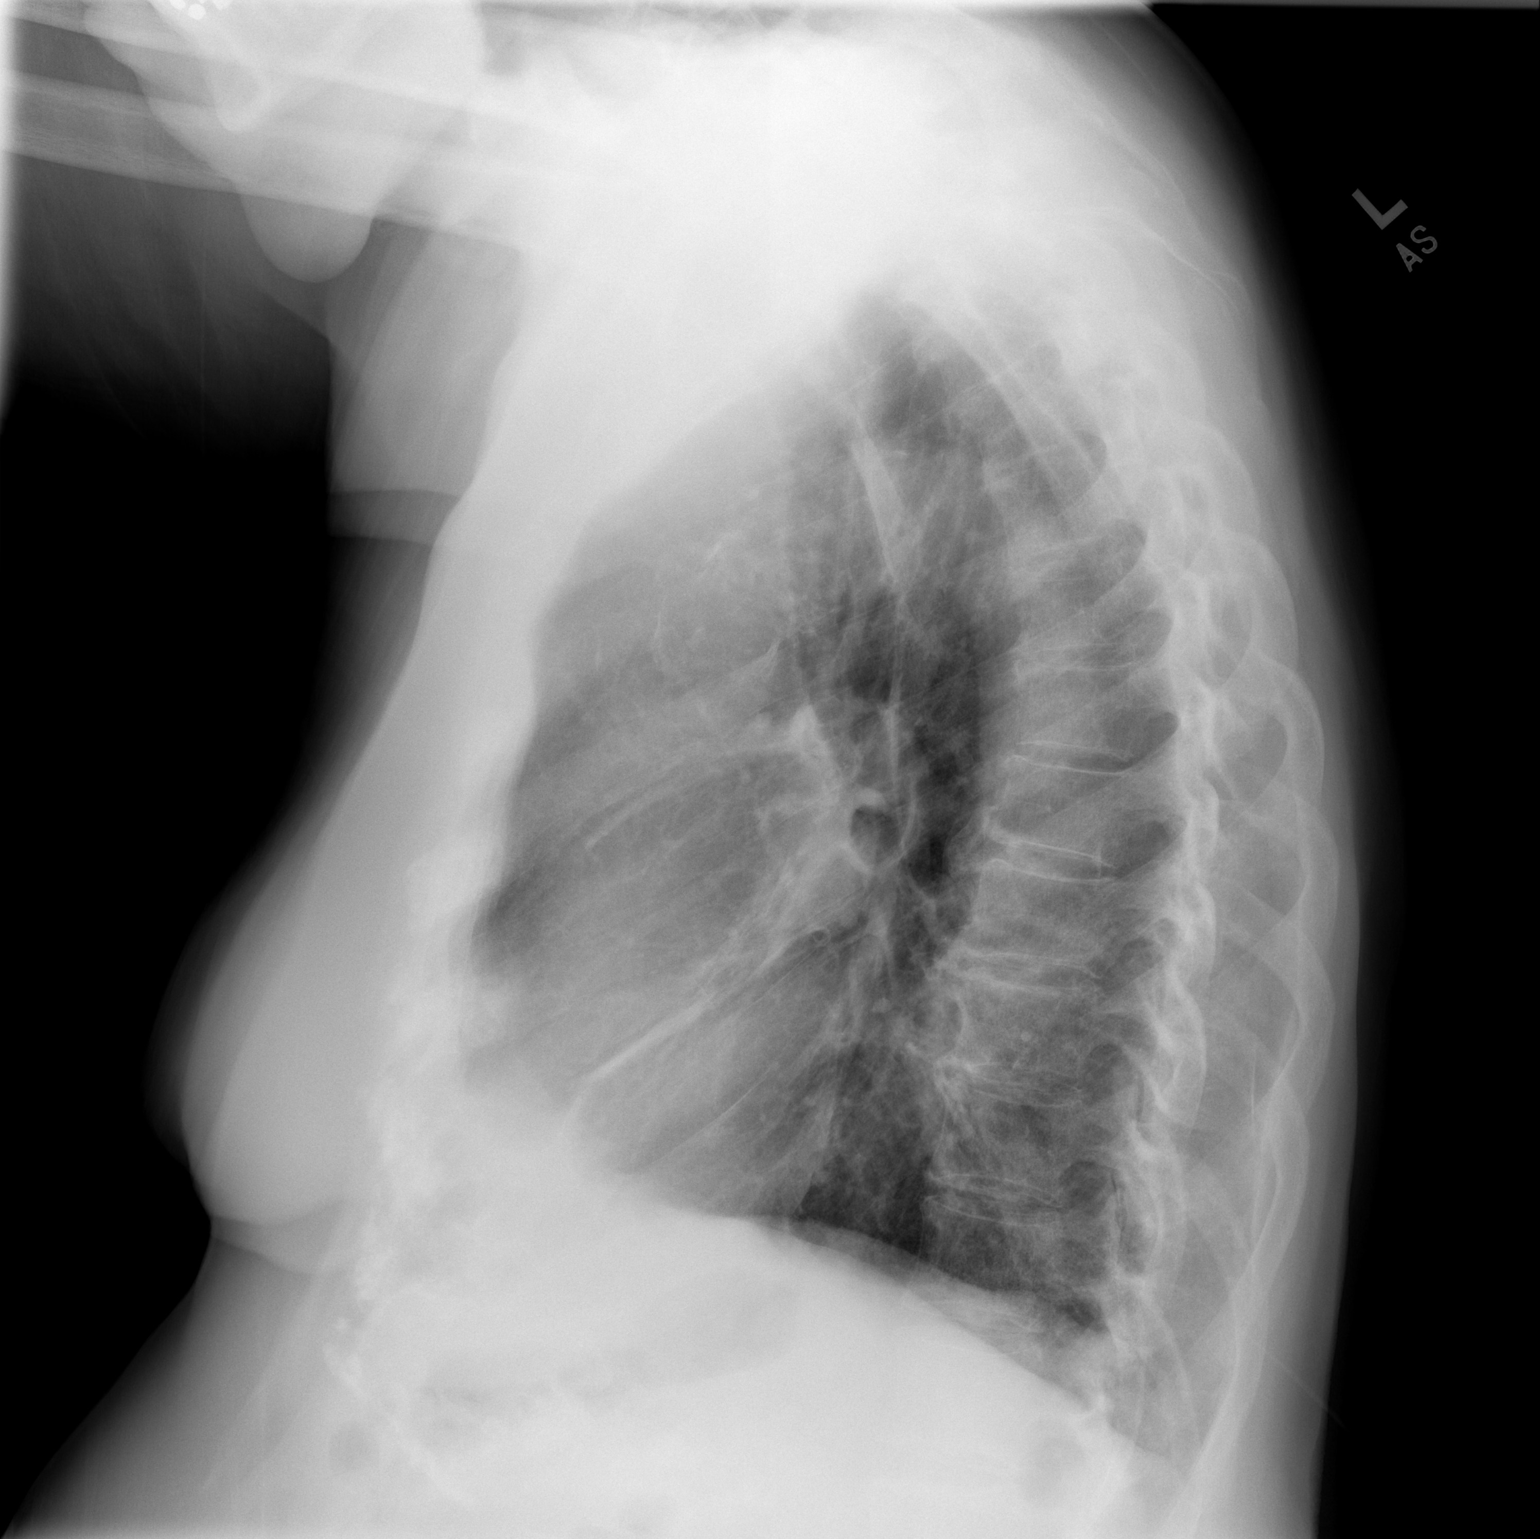

[2 of 2 positions shown; findings below may reference images not displayed]

FINDINGS: The lungs remain mildly hyperinflated. The interstitial markings are
coarse. Retrocardiac density has not significantly changed but no
discrete infiltrate is observed. The heart and pulmonary vascularity
are normal. There is calcification in the wall of the aortic arch.
There is no pleural effusion.
IMPRESSION: COPD. No acute pneumonia nor other acute cardiopulmonary
abnormality.

Thoracic aortic atherosclerosis.

## 2017-12-29 DIAGNOSIS — L820 Inflamed seborrheic keratosis: Secondary | ICD-10-CM | POA: Diagnosis not present

## 2017-12-29 DIAGNOSIS — L853 Xerosis cutis: Secondary | ICD-10-CM | POA: Diagnosis not present

## 2017-12-29 DIAGNOSIS — L814 Other melanin hyperpigmentation: Secondary | ICD-10-CM | POA: Diagnosis not present

## 2017-12-29 DIAGNOSIS — Z85820 Personal history of malignant melanoma of skin: Secondary | ICD-10-CM | POA: Diagnosis not present

## 2017-12-29 DIAGNOSIS — L821 Other seborrheic keratosis: Secondary | ICD-10-CM | POA: Diagnosis not present

## 2018-01-15 ENCOUNTER — Ambulatory Visit
Admission: RE | Admit: 2018-01-15 | Discharge: 2018-01-15 | Disposition: A | Discharge status: Home / Self Care | Payer: Medicare Other | Source: Ambulatory Visit | Attending: Geriatric Medicine | Admitting: Geriatric Medicine

## 2018-01-15 ENCOUNTER — Other Ambulatory Visit: Payer: Self-pay | Admitting: Geriatric Medicine

## 2018-01-15 DIAGNOSIS — R05 Cough: Secondary | ICD-10-CM

## 2018-01-15 DIAGNOSIS — N183 Chronic kidney disease, stage 3 (moderate): Secondary | ICD-10-CM | POA: Diagnosis not present

## 2018-01-15 DIAGNOSIS — I129 Hypertensive chronic kidney disease with stage 1 through stage 4 chronic kidney disease, or unspecified chronic kidney disease: Secondary | ICD-10-CM | POA: Diagnosis not present

## 2018-01-15 DIAGNOSIS — R059 Cough, unspecified: Secondary | ICD-10-CM

## 2018-01-15 DIAGNOSIS — F3340 Major depressive disorder, recurrent, in remission, unspecified: Secondary | ICD-10-CM | POA: Diagnosis not present

## 2018-01-15 DIAGNOSIS — Z23 Encounter for immunization: Secondary | ICD-10-CM | POA: Diagnosis not present

## 2018-01-15 DIAGNOSIS — R55 Syncope and collapse: Secondary | ICD-10-CM | POA: Diagnosis not present

## 2018-01-15 DIAGNOSIS — J301 Allergic rhinitis due to pollen: Secondary | ICD-10-CM | POA: Diagnosis not present

## 2018-03-22 DIAGNOSIS — H349 Unspecified retinal vascular occlusion: Secondary | ICD-10-CM | POA: Diagnosis not present

## 2018-03-22 DIAGNOSIS — H25043 Posterior subcapsular polar age-related cataract, bilateral: Secondary | ICD-10-CM | POA: Diagnosis not present

## 2018-03-22 DIAGNOSIS — H524 Presbyopia: Secondary | ICD-10-CM | POA: Diagnosis not present

## 2018-04-06 DIAGNOSIS — L570 Actinic keratosis: Secondary | ICD-10-CM | POA: Diagnosis not present

## 2018-04-06 DIAGNOSIS — L853 Xerosis cutis: Secondary | ICD-10-CM | POA: Diagnosis not present

## 2018-04-06 DIAGNOSIS — B86 Scabies: Secondary | ICD-10-CM | POA: Diagnosis not present

## 2018-04-06 DIAGNOSIS — Z85820 Personal history of malignant melanoma of skin: Secondary | ICD-10-CM | POA: Diagnosis not present

## 2018-04-06 DIAGNOSIS — L821 Other seborrheic keratosis: Secondary | ICD-10-CM | POA: Diagnosis not present

## 2018-04-06 DIAGNOSIS — L814 Other melanin hyperpigmentation: Secondary | ICD-10-CM | POA: Diagnosis not present

## 2018-05-11 DIAGNOSIS — H25811 Combined forms of age-related cataract, right eye: Secondary | ICD-10-CM | POA: Diagnosis not present

## 2018-05-11 DIAGNOSIS — H25041 Posterior subcapsular polar age-related cataract, right eye: Secondary | ICD-10-CM | POA: Diagnosis not present

## 2018-05-11 DIAGNOSIS — H2511 Age-related nuclear cataract, right eye: Secondary | ICD-10-CM | POA: Diagnosis not present

## 2018-05-14 DIAGNOSIS — L299 Pruritus, unspecified: Secondary | ICD-10-CM | POA: Diagnosis not present

## 2019-04-07 DIAGNOSIS — Z23 Encounter for immunization: Secondary | ICD-10-CM | POA: Diagnosis not present

## 2019-05-05 DIAGNOSIS — Z23 Encounter for immunization: Secondary | ICD-10-CM | POA: Diagnosis not present

## 2019-06-28 ENCOUNTER — Encounter (HOSPITAL_COMMUNITY): Payer: Self-pay | Admitting: Emergency Medicine

## 2019-06-28 ENCOUNTER — Emergency Department (HOSPITAL_COMMUNITY)
Admission: EM | Admit: 2019-06-28 | Discharge: 2019-06-29 | Disposition: A | Discharge status: Home / Self Care | Payer: Medicare Other | Attending: Emergency Medicine | Admitting: Emergency Medicine

## 2019-06-28 DIAGNOSIS — R197 Diarrhea, unspecified: Secondary | ICD-10-CM | POA: Diagnosis not present

## 2019-06-28 DIAGNOSIS — E876 Hypokalemia: Secondary | ICD-10-CM | POA: Insufficient documentation

## 2019-06-28 DIAGNOSIS — W1809XA Striking against other object with subsequent fall, initial encounter: Secondary | ICD-10-CM | POA: Diagnosis not present

## 2019-06-28 DIAGNOSIS — E860 Dehydration: Secondary | ICD-10-CM | POA: Diagnosis not present

## 2019-06-28 DIAGNOSIS — Y92009 Unspecified place in unspecified non-institutional (private) residence as the place of occurrence of the external cause: Secondary | ICD-10-CM

## 2019-06-28 DIAGNOSIS — R531 Weakness: Secondary | ICD-10-CM | POA: Diagnosis not present

## 2019-06-28 DIAGNOSIS — Z85038 Personal history of other malignant neoplasm of large intestine: Secondary | ICD-10-CM | POA: Insufficient documentation

## 2019-06-28 DIAGNOSIS — W19XXXA Unspecified fall, initial encounter: Secondary | ICD-10-CM

## 2019-06-28 DIAGNOSIS — R42 Dizziness and giddiness: Secondary | ICD-10-CM | POA: Diagnosis not present

## 2019-06-28 LAB — CBC WITH DIFFERENTIAL/PLATELET
Abs Immature Granulocytes: 0.08 10*3/uL — ABNORMAL HIGH (ref 0.00–0.07)
Basophils Absolute: 0 10*3/uL (ref 0.0–0.1)
Basophils Relative: 0 %
Eosinophils Absolute: 0 10*3/uL (ref 0.0–0.5)
Eosinophils Relative: 1 %
HCT: 42 % (ref 39.0–52.0)
Hemoglobin: 13.7 g/dL (ref 13.0–17.0)
Immature Granulocytes: 1 %
Lymphocytes Relative: 24 %
Lymphs Abs: 1.4 10*3/uL (ref 0.7–4.0)
MCH: 31.8 pg (ref 26.0–34.0)
MCHC: 32.6 g/dL (ref 30.0–36.0)
MCV: 97.4 fL (ref 80.0–100.0)
Monocytes Absolute: 1.9 10*3/uL — ABNORMAL HIGH (ref 0.1–1.0)
Monocytes Relative: 33 %
Neutro Abs: 2.4 10*3/uL (ref 1.7–7.7)
Neutrophils Relative %: 41 %
Platelets: 135 10*3/uL — ABNORMAL LOW (ref 150–400)
RBC: 4.31 MIL/uL (ref 4.22–5.81)
RDW: 13.2 % (ref 11.5–15.5)
WBC: 5.9 10*3/uL (ref 4.0–10.5)
nRBC: 0 % (ref 0.0–0.2)

## 2019-06-28 LAB — COMPREHENSIVE METABOLIC PANEL
ALT: 10 U/L (ref 0–44)
AST: 17 U/L (ref 15–41)
Albumin: 3.4 g/dL — ABNORMAL LOW (ref 3.5–5.0)
Alkaline Phosphatase: 53 U/L (ref 38–126)
Anion gap: 8 (ref 5–15)
BUN: 18 mg/dL (ref 8–23)
CO2: 20 mmol/L — ABNORMAL LOW (ref 22–32)
Calcium: 8.5 mg/dL — ABNORMAL LOW (ref 8.9–10.3)
Chloride: 112 mmol/L — ABNORMAL HIGH (ref 98–111)
Creatinine, Ser: 1.25 mg/dL — ABNORMAL HIGH (ref 0.61–1.24)
GFR calc Af Amer: 54 mL/min — ABNORMAL LOW (ref >60)
GFR calc non Af Amer: 47 mL/min — ABNORMAL LOW (ref >60)
Glucose, Bld: 102 mg/dL — ABNORMAL HIGH (ref 70–99)
Potassium: 3.3 mmol/L — ABNORMAL LOW (ref 3.5–5.1)
Sodium: 140 mmol/L (ref 135–145)
Total Bilirubin: 1.4 mg/dL — ABNORMAL HIGH (ref 0.3–1.2)
Total Protein: 6.4 g/dL — ABNORMAL LOW (ref 6.5–8.1)

## 2019-06-28 LAB — CBG MONITORING, ED: Glucose-Capillary: 93 mg/dL (ref 70–99)

## 2019-06-28 MED ORDER — POTASSIUM CHLORIDE CRYS ER 20 MEQ PO TBCR
40.0000 meq | EXTENDED_RELEASE_TABLET | Freq: Once | ORAL | Status: AC
  Administered 2019-06-28: 40 meq via ORAL
  Filled 2019-06-28: qty 2

## 2019-06-28 NOTE — ED Triage Notes (Signed)
Pt to ED from [facility] assisted living with c/o a fall.  Pt denies any pain.  EMS also reports staff at assisted living reports pt has had multiple episodes of diarrhea all day today and has not eaten or drank any fluids.  EMS gave pt 515ml of NS prior to arrival to ED  Pt denies any nausea or vomiting

## 2019-06-28 NOTE — ED Notes (Signed)
[facility] called to transport pt 

## 2019-06-28 NOTE — Discharge Instructions (Addendum)
Make sure you are drinking plenty of fluids and eating enough food. This will prevent you from feeling lightheaded. Follow-up with your primary care provider. Return to the ER if you start to experience worsening diarrhea, abdominal pain, chest pain, headache or blurry vision.

## 2019-06-28 NOTE — ED Provider Notes (Signed)
[facility] EMERGENCY DEPARTMENT Provider Note   CSN: [id number] Arrival date & time: 06/28/19  1825     History Chief Complaint  Patient presents with  . Diarrhea    Ricky Nguyen is a 86 y.o. male with a past medical history of depression, BPH presenting to the ED after fall.  Patient states that he "fainted because I felt so weak" but denies any loss of consciousness.  He does admit to several episodes of watery diarrhea today which caused him to have a decreased appetite.  He has not had anything to eat or drink today.  Denies any nausea, vomiting, suspicious food ingestions last night.  No sick contacts with similar symptoms.  States that he did not hit his head when he had the fall.  Denies any headache, blurry vision, chest pain, urinary symptoms, fever.  HPI     Past Medical History:  Diagnosis Date  . Bladder tumor   . BPH (benign prostatic hyperplasia)   . Cancer [facility])    Colon  . Depression     Patient Active Problem List   Diagnosis Date Noted  . Obesity 01/15/2017  . Bladder tumor 01/15/2017  . Colon cancer ([place]) 01/15/2017  . Depression 01/15/2017  . Displacement of cervical intervertebral disc without myelopathy 01/15/2017  . Displacement of lumbar intervertebral disc without myelopathy 01/15/2017  . Hematochezia 01/15/2017  . Hemorrhage of rectum and anus 01/15/2017  . Insomnia, unspecified 01/15/2017  . Pain in joint, pelvic region and thigh 01/15/2017  . Pain in joint, shoulder region 01/15/2017  . Peripheral neuropathy 01/15/2017  . Routine general medical examination at health care facility 01/15/2017  . Sciatica 01/15/2017  . Anemia, unspecified 01/15/2017  . Constipation, unspecified 01/15/2017  . Abnormal computed tomography of gastrointestinal tract 01/15/2017  . Abdominal pain, right lower quadrant 03/01/2013    Past Surgical History:  Procedure Laterality Date  . aortic ileac bypsss    . COLON SURGERY     Colorectal cancer  . HERNIA REPAIR     RIH repair/incarcerated  . TRANSURETHRAL RESECTION OF PROSTATE  06/2003       No family history on file.  Social History   Tobacco Use  . Smoking status: Never Smoker  . Smokeless tobacco: Former Network engineer Use Topics  . Alcohol use: Yes    Alcohol/week: 1.0 standard drinks    Types: 1 Glasses of wine per week    Comment: daily   . Drug use: No    Home Medications Prior to Admission medications   Medication Sig Start Date End Date Taking? Authorizing Provider  meclizine (ANTIVERT) 25 MG tablet Take 1 tablet (25 mg total) by mouth 3 (three) times daily as needed for dizziness. Patient not taking: Reported on 01/15/2017 08/06/16   Varney Biles, MD    Allergies    Patient has no known allergies.  Review of Systems   Review of Systems  Constitutional: Negative for appetite change, chills and fever.  HENT: Negative for ear pain, rhinorrhea, sneezing and sore throat.   Eyes: Negative for photophobia and visual disturbance.  Respiratory: Negative for cough, chest tightness, shortness of breath and wheezing.   Cardiovascular: Negative for chest pain and palpitations.  Gastrointestinal: Positive for diarrhea. Negative for abdominal pain, blood in stool, constipation, nausea and vomiting.  Genitourinary: Negative for dysuria, hematuria and urgency.  Musculoskeletal: Negative for myalgias.  Skin: Negative for rash.  Neurological: Positive for light-headedness. Negative for dizziness and weakness.  Physical Exam Updated Vital Signs BP 128/82   Pulse 69   Temp 98.3 F (36.8 C) (Oral)   Resp 13   Ht 5\' 8"  (1.727 m)   Wt 99.8 kg   SpO2 96%   BMI 33.45 kg/m   Physical Exam Vitals and nursing note reviewed.  Constitutional:      General: He is not in acute distress.    Appearance: He is well-developed.  HENT:     Head: Normocephalic and atraumatic.     Nose: Nose normal.  Eyes:     General: No scleral icterus.        Right eye: No discharge.        Left eye: No discharge.     Conjunctiva/sclera: Conjunctivae normal.     Pupils: Pupils are equal, round, and reactive to light.  Cardiovascular:     Rate and Rhythm: Normal rate and regular rhythm.     Heart sounds: Normal heart sounds. No murmur. No friction rub. No gallop.   Pulmonary:     Effort: Pulmonary effort is normal. No respiratory distress.     Breath sounds: Normal breath sounds.  Abdominal:     General: Bowel sounds are normal. There is no distension.     Palpations: Abdomen is soft.     Tenderness: There is no abdominal tenderness. There is no guarding.  Musculoskeletal:        General: Normal range of motion.     Cervical back: Normal range of motion and neck supple.  Skin:    General: Skin is warm and dry.     Findings: No rash.  Neurological:     General: No focal deficit present.     Mental Status: He is alert.     Cranial Nerves: No cranial nerve deficit.     Sensory: No sensory deficit.     Motor: No weakness or abnormal muscle tone.     Coordination: Coordination normal.     Comments: Alert, oriented to self, place, situation and family members.  Believes it is April 1919. Pupils reactive. No facial asymmetry noted. Cranial nerves appear grossly intact. Sensation intact to light touch on face, BUE and BLE. Strength 5/5 in BUE and BLE. Normal finger to nose coordination bilaterally.     ED Results / Procedures / Treatments   Labs (all labs ordered are listed, but only abnormal results are displayed) Labs Reviewed  COMPREHENSIVE METABOLIC PANEL - Abnormal; Notable for the following components:      Result Value   Potassium 3.3 (*)    Chloride 112 (*)    CO2 20 (*)    Glucose, Bld 102 (*)    Creatinine, Ser 1.25 (*)    Calcium 8.5 (*)    Total Protein 6.4 (*)    Albumin 3.4 (*)    Total Bilirubin 1.4 (*)    GFR calc non Af Amer 47 (*)    GFR calc Af Amer 54 (*)    All other components within normal limits  CBC WITH  DIFFERENTIAL/PLATELET - Abnormal; Notable for the following components:   Platelets 135 (*)    Monocytes Absolute 1.9 (*)    Abs Immature Granulocytes 0.08 (*)    All other components within normal limits  CBG MONITORING, ED    EKG None  Radiology No results found.  Procedures Procedures (including critical care time)  Medications Ordered in ED Medications  potassium chloride SA (KLOR-CON) CR tablet 40 mEq (has no administration in time range)  ED Course  I have reviewed the triage vital signs and the nursing notes.  Pertinent labs & imaging results that were available during my care of the patient were reviewed by me and considered in my medical decision making (see chart for details).    MDM Rules/Calculators/A&P                      86 year old male presents to ED with a chief complaint of fall.  States that he felt weak today, had an episode where he "fainted" but denies any loss of consciousness.  He admits to several episodes of watery diarrhea today which caused him to have a decreased appetite.  He has not had anything to eat or drink today.  Denies any vomiting.  Denies any head injury or headache.  On exam patient is overall well-appearing.  Moving extremities without difficulty.  No deficits neurological exam noted.  He is alert and oriented at his baseline.  He is declining imaging today which I feel is reasonable as his exam is unremarkable.  Lab work shows hypokalemia of 3.3 which is repleted orally.  Creatinine is at baseline.  CBC is unremarkable.  EKG shows normal sinus rhythm, no ischemic changes.  Patient is requesting discharge home which I feel is reasonable.  Educated that he is most likely feeling lightheaded or weak because of his decreased appetite.  Stated importance of increasing his hydration as well as his p.o. intake.  Suspect that his diarrhea could be viral in nature.  Doubt diverticulitis, colitis.  At this time I doubt his symptoms are due to ACS,  CVA or other emergent cause of his symptoms.  I have tried to get in touch with patient's family but was unable to do so.  Patient is hemodynamically stable, in NAD. Evaluation does not show pathology that would require ongoing emergent intervention or inpatient treatment. I have personally reviewed and interpreted all lab work and imaging at today's ED visit. I explained the diagnosis to the patient. Pain has been managed and has no complaints prior to discharge. Patient is comfortable with above plan and is stable for discharge at this time. All questions were answered prior to disposition. Strict return precautions for returning to the ED were discussed. Encouraged follow up with PCP.   An After Visit Summary was printed and given to the patient.   Portions of this note were generated with Lobbyist. Dictation errors may occur despite best attempts at proofreading.  Final Clinical Impression(s) / ED Diagnoses Final diagnoses:  Diarrhea, unspecified type  Fall in home, initial encounter  Hypokalemia    Rx / DC Orders ED Discharge Orders    None       Delia Heady, PA-C 06/28/19 2300    Charlesetta Shanks, MD 06/28/19 2311

## 2019-06-29 DIAGNOSIS — R279 Unspecified lack of coordination: Secondary | ICD-10-CM | POA: Diagnosis not present

## 2019-06-29 DIAGNOSIS — R0902 Hypoxemia: Secondary | ICD-10-CM | POA: Diagnosis not present

## 2019-06-29 DIAGNOSIS — R197 Diarrhea, unspecified: Secondary | ICD-10-CM | POA: Diagnosis not present

## 2019-06-29 DIAGNOSIS — I10 Essential (primary) hypertension: Secondary | ICD-10-CM | POA: Diagnosis not present

## 2019-06-29 DIAGNOSIS — Z743 Need for continuous supervision: Secondary | ICD-10-CM | POA: Diagnosis not present

## 2019-07-13 DIAGNOSIS — Z961 Presence of intraocular lens: Secondary | ICD-10-CM | POA: Diagnosis not present

## 2019-07-13 DIAGNOSIS — H52201 Unspecified astigmatism, right eye: Secondary | ICD-10-CM | POA: Diagnosis not present

## 2019-07-13 DIAGNOSIS — H5202 Hypermetropia, left eye: Secondary | ICD-10-CM | POA: Diagnosis not present

## 2019-07-13 DIAGNOSIS — H2512 Age-related nuclear cataract, left eye: Secondary | ICD-10-CM | POA: Diagnosis not present

## 2019-07-15 DIAGNOSIS — Z1389 Encounter for screening for other disorder: Secondary | ICD-10-CM | POA: Diagnosis not present

## 2019-07-15 DIAGNOSIS — R269 Unspecified abnormalities of gait and mobility: Secondary | ICD-10-CM | POA: Diagnosis not present

## 2019-07-15 DIAGNOSIS — I700 Atherosclerosis of aorta: Secondary | ICD-10-CM | POA: Diagnosis not present

## 2019-07-15 DIAGNOSIS — Z0000 Encounter for general adult medical examination without abnormal findings: Secondary | ICD-10-CM | POA: Diagnosis not present

## 2019-07-15 DIAGNOSIS — R05 Cough: Secondary | ICD-10-CM | POA: Diagnosis not present

## 2019-07-15 DIAGNOSIS — Z79899 Other long term (current) drug therapy: Secondary | ICD-10-CM | POA: Diagnosis not present

## 2019-07-15 DIAGNOSIS — N1831 Chronic kidney disease, stage 3a: Secondary | ICD-10-CM | POA: Diagnosis not present

## 2019-07-15 DIAGNOSIS — I129 Hypertensive chronic kidney disease with stage 1 through stage 4 chronic kidney disease, or unspecified chronic kidney disease: Secondary | ICD-10-CM | POA: Diagnosis not present

## 2019-07-15 DIAGNOSIS — F3340 Major depressive disorder, recurrent, in remission, unspecified: Secondary | ICD-10-CM | POA: Diagnosis not present

## 2020-01-11 ENCOUNTER — Encounter (HOSPITAL_COMMUNITY): Payer: Self-pay

## 2020-01-11 ENCOUNTER — Emergency Department (HOSPITAL_COMMUNITY)
Admission: EM | Admit: 2020-01-11 | Discharge: 2020-01-12 | Disposition: A | Discharge status: Home / Self Care | Payer: Medicare Other | Attending: Emergency Medicine | Admitting: Emergency Medicine

## 2020-01-11 ENCOUNTER — Other Ambulatory Visit: Payer: Self-pay

## 2020-01-11 DIAGNOSIS — Z85038 Personal history of other malignant neoplasm of large intestine: Secondary | ICD-10-CM | POA: Diagnosis not present

## 2020-01-11 DIAGNOSIS — R52 Pain, unspecified: Secondary | ICD-10-CM | POA: Diagnosis not present

## 2020-01-11 DIAGNOSIS — Z87891 Personal history of nicotine dependence: Secondary | ICD-10-CM | POA: Diagnosis not present

## 2020-01-11 DIAGNOSIS — R42 Dizziness and giddiness: Secondary | ICD-10-CM | POA: Diagnosis not present

## 2020-01-11 DIAGNOSIS — R29810 Facial weakness: Secondary | ICD-10-CM | POA: Diagnosis not present

## 2020-01-11 DIAGNOSIS — M79605 Pain in left leg: Secondary | ICD-10-CM | POA: Diagnosis not present

## 2020-01-11 DIAGNOSIS — R252 Cramp and spasm: Secondary | ICD-10-CM | POA: Diagnosis not present

## 2020-01-11 DIAGNOSIS — R0902 Hypoxemia: Secondary | ICD-10-CM | POA: Diagnosis not present

## 2020-01-11 LAB — CBC
HCT: 42 % (ref 39.0–52.0)
Hemoglobin: 13.3 g/dL (ref 13.0–17.0)
MCH: 31.7 pg (ref 26.0–34.0)
MCHC: 31.7 g/dL (ref 30.0–36.0)
MCV: 100 fL (ref 80.0–100.0)
Platelets: 149 10*3/uL — ABNORMAL LOW (ref 150–400)
RBC: 4.2 MIL/uL — ABNORMAL LOW (ref 4.22–5.81)
RDW: 13.6 % (ref 11.5–15.5)
WBC: 6 10*3/uL (ref 4.0–10.5)
nRBC: 0 % (ref 0.0–0.2)

## 2020-01-11 LAB — COMPREHENSIVE METABOLIC PANEL
ALT: 10 U/L (ref 0–44)
AST: 20 U/L (ref 15–41)
Albumin: 3.9 g/dL (ref 3.5–5.0)
Alkaline Phosphatase: 60 U/L (ref 38–126)
Anion gap: 9 (ref 5–15)
BUN: 17 mg/dL (ref 8–23)
CO2: 23 mmol/L (ref 22–32)
Calcium: 9.3 mg/dL (ref 8.9–10.3)
Chloride: 107 mmol/L (ref 98–111)
Creatinine, Ser: 1.21 mg/dL (ref 0.61–1.24)
GFR, Estimated: 53 mL/min — ABNORMAL LOW (ref >60)
Glucose, Bld: 91 mg/dL (ref 70–99)
Potassium: 4.5 mmol/L (ref 3.5–5.1)
Sodium: 139 mmol/L (ref 135–145)
Total Bilirubin: 1.1 mg/dL (ref 0.3–1.2)
Total Protein: 6.6 g/dL (ref 6.5–8.1)

## 2020-01-11 LAB — TROPONIN I (HIGH SENSITIVITY)
Troponin I (High Sensitivity): 9 ng/L (ref <18)
Troponin I (High Sensitivity): 9 ng/L (ref <18)

## 2020-01-11 MED ORDER — HYDROCHLOROTHIAZIDE 25 MG PO TABS: 25.0000 mg | ORAL_TABLET | Freq: Once | ORAL | Status: DC

## 2020-01-11 NOTE — ED Triage Notes (Signed)
Pt to rm11 via EMS from [facility] assisted living facility. Pt c/o dizziness with standing and left leg cramping/tingling. EMS states they were called out for same twice today. Pt GCS=15, denies pain or dizziness on arrival.  EMS VS:  168/104, 84, 94% room air. CBG=123

## 2020-01-11 NOTE — ED Provider Notes (Signed)
[facility] Emergency Department Provider Note MRN:  [id number]  Arrival date & time: 01/11/20     Chief Complaint   Dizziness   History of Present Illness   Ricky Nguyen is a 86 y.o. year-old male with remote history of colon cancer presenting to the ED with chief complaint of dizziness.  Dizziness when standing from a seated position today.  Not currently present.  Described as a lightheadedness.  Also endorsing some tightness to the back of the left leg worse with motion or palpation.  Denies any chest pain or shortness of breath, no abdominal pain, no headache or vision change, no falls, no other complaints.  Symptoms are mild, intermittent.  Review of Systems  A complete 10 system review of systems was obtained and all systems are negative except as noted in the HPI and PMH.   Patient's Health History    Past Medical History:  Diagnosis Date  . Bladder tumor   . BPH (benign prostatic hyperplasia)   . Cancer [facility])    Colon  . Depression     Past Surgical History:  Procedure Laterality Date  . aortic ileac bypsss    . COLON SURGERY     Colorectal cancer  . HERNIA REPAIR     RIH repair/incarcerated  . TRANSURETHRAL RESECTION OF PROSTATE  06/2003    History reviewed. No pertinent family history.  Social History   Socioeconomic History  . Marital status: Widowed    Spouse name: Not on file  . Number of children: Not on file  . Years of education: Not on file  . Highest education level: Not on file  Occupational History  . Not on file  Tobacco Use  . Smoking status: Never Smoker  . Smokeless tobacco: Former Network engineer and Sexual Activity  . Alcohol use: Yes    Alcohol/week: 1.0 standard drink    Types: 1 Glasses of wine per week    Comment: daily   . Drug use: No  . Sexual activity: Not on file  Other Topics Concern  . Not on file  Social History Narrative  . Not on file   Social Determinants of Health   Financial  Resource Strain:   . Difficulty of Paying Living Expenses: Not on file  Food Insecurity:   . Worried About Charity fundraiser in the Last Year: Not on file  . Ran Out of Food in the Last Year: Not on file  Transportation Needs:   . Lack of Transportation (Medical): Not on file  . Lack of Transportation (Non-Medical): Not on file  Physical Activity:   . Days of Exercise per Week: Not on file  . Minutes of Exercise per Session: Not on file  Stress:   . Feeling of Stress : Not on file  Social Connections:   . Frequency of Communication with Friends and Family: Not on file  . Frequency of Social Gatherings with Friends and Family: Not on file  . Attends Religious Services: Not on file  . Active Member of Clubs or Organizations: Not on file  . Attends Archivist Meetings: Not on file  . Marital Status: Not on file  Intimate Partner Violence:   . Fear of Current or Ex-Partner: Not on file  . Emotionally Abused: Not on file  . Physically Abused: Not on file  . Sexually Abused: Not on file     Physical Exam   Vitals:   01/11/20 2100 01/11/20 2130  BP: Marland Kitchen)  191/114 (!) 181/111  Pulse: 73 81  Resp: 15 11  Temp:    SpO2: 93% 94%    CONSTITUTIONAL: Well-appearing, NAD NEURO: Normal and symmetric strength and sensation, normal coordination, normal speech EYES:  eyes equal and reactive ENT/NECK:  no LAD, no JVD CARDIO: Regular rate, well-perfused, normal S1 and S2 PULM:  CTAB no wheezing or rhonchi GI/GU:  normal bowel sounds, non-distended, non-tender MSK/SPINE: Pain elicited to the left hamstring with passive straight leg raise SKIN:  no rash, atraumatic PSYCH:  Appropriate speech and behavior  *Additional and/or pertinent findings included in MDM below  Diagnostic and Interventional Summary    EKG Interpretation  Date/Time:  Wednesday January 11 2020 18:07:41 EDT Ventricular Rate:  76 PR Interval:    QRS Duration: 88 QT Interval:  390 QTC Calculation: 439 R  Axis:   35 Text Interpretation: Sinus rhythm Abnormal R-wave progression, early transition Confirmed by Gerlene Fee [phone]) on 01/11/2020 7:12:30 PM      Labs Reviewed  CBC - Abnormal; Notable for the following components:      Result Value   RBC 4.20 (*)    Platelets 149 (*)    All other components within normal limits  COMPREHENSIVE METABOLIC PANEL - Abnormal; Notable for the following components:   GFR, Estimated 53 (*)    All other components within normal limits  TROPONIN I (HIGH SENSITIVITY)  TROPONIN I (HIGH SENSITIVITY)    LE VENOUS    (Results Pending)    Medications - No data to display   Procedures  /  Critical Care Procedures  ED Course and Medical Decision Making  I have reviewed the triage vital signs, the nursing notes, and pertinent available records from the EMR.  Listed above are laboratory and imaging tests that I personally ordered, reviewed, and interpreted and then considered in my medical decision making (see below for details).  Patient is currently without complaints, mildly hypertensive but seems to be at his baseline and otherwise very normal vital signs, no chest pain, no shortness of breath, no evidence of DVT on exam, symptoms seem most suggestive of mild orthostatic hypotension.  No vertigo, no nystagmus, no neurological deficits, highly doubt that this dizziness is neurologic.  Legs are symmetric with no swelling or erythema, does have pain to the hamstring but this overall seems very inconsistent with DVT/PE presentation.  Will obtain some screening labs and monitor closely.     Several hours here in the emergency department with no symptoms.  Labs reassuring, troponin negative x2.  I was told that facility was concerned about some swelling or tenderness to the left leg today.  Patient did seem to have hamstring tenderness earlier but not currently, he has no asymmetry to his legs whatsoever at this time, no increased warmth, no erythema, no edema,  no tenderness.  Overall very low concern for DVT, however after discussing with patient's daughter-in-law will order DVT ultrasound for the morning.  Appropriate for discharge.  Barth Kirks. Sedonia Small, [place] [email or web address]  Final Clinical Impressions(s) / ED Diagnoses     ICD-10-CM   1. Lightheadedness  R42     ED Discharge Orders         Ordered    LE VENOUS        01/11/20 2155           Discharge Instructions Discussed with and Provided to Patient:     Discharge Instructions     You were  evaluated in the Emergency Department and after careful evaluation, we did not find any emergent condition requiring admission or further testing in the hospital.  Your exam/testing today is overall reassuring.  We recommend follow-up with your primary care doctor to discuss your elevated blood pressure.  Given your recent leg discomfort, we have ordered an ultrasound for tomorrow morning.  Please return to the Emergency Department if you experience any worsening of your condition.   Thank you for allowing [place] to be a part of your care.       Maudie Flakes, MD 01/11/20 2157

## 2020-01-11 NOTE — Discharge Instructions (Addendum)
You were evaluated in the Emergency Department and after careful evaluation, we did not find any emergent condition requiring admission or further testing in the hospital.  Your exam/testing today is overall reassuring.  We recommend follow-up with your primary care doctor to discuss your elevated blood pressure.  Given your recent leg discomfort, we have ordered an ultrasound for tomorrow morning.  Please return to the Emergency Department if you experience any worsening of your condition.   Thank you for allowing [place] to be a part of your care.

## 2020-01-12 ENCOUNTER — Emergency Department (HOSPITAL_BASED_OUTPATIENT_CLINIC_OR_DEPARTMENT_OTHER)
Admission: RE | Admit: 2020-01-12 | Discharge: 2020-01-12 | Disposition: A | Discharge status: Home / Self Care | Payer: Medicare Other | Source: Ambulatory Visit | Attending: Emergency Medicine | Admitting: Emergency Medicine

## 2020-01-12 DIAGNOSIS — F32A Depression, unspecified: Secondary | ICD-10-CM | POA: Diagnosis not present

## 2020-01-12 DIAGNOSIS — R279 Unspecified lack of coordination: Secondary | ICD-10-CM | POA: Diagnosis not present

## 2020-01-12 DIAGNOSIS — M79609 Pain in unspecified limb: Secondary | ICD-10-CM | POA: Diagnosis not present

## 2020-01-12 DIAGNOSIS — R42 Dizziness and giddiness: Secondary | ICD-10-CM | POA: Diagnosis not present

## 2020-01-12 DIAGNOSIS — M7989 Other specified soft tissue disorders: Secondary | ICD-10-CM

## 2020-01-12 DIAGNOSIS — Z743 Need for continuous supervision: Secondary | ICD-10-CM | POA: Diagnosis not present

## 2020-01-12 NOTE — Progress Notes (Signed)
Left lower extremity venous duplex completed. Refer to "CV Proc" under chart review to view preliminary results.  01/12/2020 11:46 AM Kelby Aline., MHA, RVT, RDCS, RDMS

## 2020-01-19 DIAGNOSIS — F329 Major depressive disorder, single episode, unspecified: Secondary | ICD-10-CM | POA: Diagnosis not present

## 2020-01-19 DIAGNOSIS — N400 Enlarged prostate without lower urinary tract symptoms: Secondary | ICD-10-CM | POA: Diagnosis not present

## 2020-01-19 DIAGNOSIS — M1990 Unspecified osteoarthritis, unspecified site: Secondary | ICD-10-CM | POA: Diagnosis not present

## 2020-01-19 DIAGNOSIS — Z85038 Personal history of other malignant neoplasm of large intestine: Secondary | ICD-10-CM | POA: Diagnosis not present

## 2020-01-19 DIAGNOSIS — R42 Dizziness and giddiness: Secondary | ICD-10-CM | POA: Diagnosis not present

## 2020-01-19 DIAGNOSIS — H8190 Unspecified disorder of vestibular function, unspecified ear: Secondary | ICD-10-CM | POA: Diagnosis not present

## 2020-01-21 DIAGNOSIS — R32 Unspecified urinary incontinence: Secondary | ICD-10-CM | POA: Diagnosis not present

## 2020-01-21 DIAGNOSIS — R4189 Other symptoms and signs involving cognitive functions and awareness: Secondary | ICD-10-CM | POA: Diagnosis not present

## 2020-01-21 DIAGNOSIS — C189 Malignant neoplasm of colon, unspecified: Secondary | ICD-10-CM | POA: Diagnosis not present

## 2020-01-21 DIAGNOSIS — H547 Unspecified visual loss: Secondary | ICD-10-CM | POA: Diagnosis not present

## 2020-01-21 DIAGNOSIS — F32A Depression, unspecified: Secondary | ICD-10-CM | POA: Diagnosis not present

## 2020-01-21 DIAGNOSIS — M150 Primary generalized (osteo)arthritis: Secondary | ICD-10-CM | POA: Diagnosis not present

## 2020-01-21 DIAGNOSIS — S51011D Laceration without foreign body of right elbow, subsequent encounter: Secondary | ICD-10-CM | POA: Diagnosis not present

## 2020-01-21 DIAGNOSIS — M1990 Unspecified osteoarthritis, unspecified site: Secondary | ICD-10-CM | POA: Diagnosis not present

## 2020-01-21 DIAGNOSIS — N401 Enlarged prostate with lower urinary tract symptoms: Secondary | ICD-10-CM | POA: Diagnosis not present

## 2020-01-21 DIAGNOSIS — Z8551 Personal history of malignant neoplasm of bladder: Secondary | ICD-10-CM | POA: Diagnosis not present

## 2020-01-21 DIAGNOSIS — Z85038 Personal history of other malignant neoplasm of large intestine: Secondary | ICD-10-CM | POA: Diagnosis not present

## 2020-01-24 DIAGNOSIS — H547 Unspecified visual loss: Secondary | ICD-10-CM | POA: Diagnosis not present

## 2020-01-24 DIAGNOSIS — F32A Depression, unspecified: Secondary | ICD-10-CM | POA: Diagnosis not present

## 2020-01-24 DIAGNOSIS — R4189 Other symptoms and signs involving cognitive functions and awareness: Secondary | ICD-10-CM | POA: Diagnosis not present

## 2020-01-24 DIAGNOSIS — N401 Enlarged prostate with lower urinary tract symptoms: Secondary | ICD-10-CM | POA: Diagnosis not present

## 2020-01-24 DIAGNOSIS — S51011D Laceration without foreign body of right elbow, subsequent encounter: Secondary | ICD-10-CM | POA: Diagnosis not present

## 2020-01-24 DIAGNOSIS — M150 Primary generalized (osteo)arthritis: Secondary | ICD-10-CM | POA: Diagnosis not present

## 2020-01-26 DIAGNOSIS — H547 Unspecified visual loss: Secondary | ICD-10-CM | POA: Diagnosis not present

## 2020-01-26 DIAGNOSIS — S51011D Laceration without foreign body of right elbow, subsequent encounter: Secondary | ICD-10-CM | POA: Diagnosis not present

## 2020-01-26 DIAGNOSIS — R4189 Other symptoms and signs involving cognitive functions and awareness: Secondary | ICD-10-CM | POA: Diagnosis not present

## 2020-01-26 DIAGNOSIS — F32A Depression, unspecified: Secondary | ICD-10-CM | POA: Diagnosis not present

## 2020-01-26 DIAGNOSIS — M150 Primary generalized (osteo)arthritis: Secondary | ICD-10-CM | POA: Diagnosis not present

## 2020-01-26 DIAGNOSIS — N401 Enlarged prostate with lower urinary tract symptoms: Secondary | ICD-10-CM | POA: Diagnosis not present

## 2020-01-27 DIAGNOSIS — M150 Primary generalized (osteo)arthritis: Secondary | ICD-10-CM | POA: Diagnosis not present

## 2020-01-27 DIAGNOSIS — F32A Depression, unspecified: Secondary | ICD-10-CM | POA: Diagnosis not present

## 2020-01-27 DIAGNOSIS — H547 Unspecified visual loss: Secondary | ICD-10-CM | POA: Diagnosis not present

## 2020-01-27 DIAGNOSIS — N401 Enlarged prostate with lower urinary tract symptoms: Secondary | ICD-10-CM | POA: Diagnosis not present

## 2020-01-27 DIAGNOSIS — R4189 Other symptoms and signs involving cognitive functions and awareness: Secondary | ICD-10-CM | POA: Diagnosis not present

## 2020-01-27 DIAGNOSIS — S51011D Laceration without foreign body of right elbow, subsequent encounter: Secondary | ICD-10-CM | POA: Diagnosis not present

## 2020-01-31 DIAGNOSIS — M150 Primary generalized (osteo)arthritis: Secondary | ICD-10-CM | POA: Diagnosis not present

## 2020-01-31 DIAGNOSIS — S51011D Laceration without foreign body of right elbow, subsequent encounter: Secondary | ICD-10-CM | POA: Diagnosis not present

## 2020-01-31 DIAGNOSIS — R4189 Other symptoms and signs involving cognitive functions and awareness: Secondary | ICD-10-CM | POA: Diagnosis not present

## 2020-01-31 DIAGNOSIS — H547 Unspecified visual loss: Secondary | ICD-10-CM | POA: Diagnosis not present

## 2020-01-31 DIAGNOSIS — N401 Enlarged prostate with lower urinary tract symptoms: Secondary | ICD-10-CM | POA: Diagnosis not present

## 2020-01-31 DIAGNOSIS — F32A Depression, unspecified: Secondary | ICD-10-CM | POA: Diagnosis not present

## 2020-02-02 DIAGNOSIS — B351 Tinea unguium: Secondary | ICD-10-CM | POA: Diagnosis not present

## 2020-02-02 DIAGNOSIS — M2041 Other hammer toe(s) (acquired), right foot: Secondary | ICD-10-CM | POA: Diagnosis not present

## 2020-02-03 DIAGNOSIS — R4189 Other symptoms and signs involving cognitive functions and awareness: Secondary | ICD-10-CM | POA: Diagnosis not present

## 2020-02-03 DIAGNOSIS — S51011D Laceration without foreign body of right elbow, subsequent encounter: Secondary | ICD-10-CM | POA: Diagnosis not present

## 2020-02-03 DIAGNOSIS — H547 Unspecified visual loss: Secondary | ICD-10-CM | POA: Diagnosis not present

## 2020-02-03 DIAGNOSIS — M150 Primary generalized (osteo)arthritis: Secondary | ICD-10-CM | POA: Diagnosis not present

## 2020-02-03 DIAGNOSIS — F32A Depression, unspecified: Secondary | ICD-10-CM | POA: Diagnosis not present

## 2020-02-03 DIAGNOSIS — N401 Enlarged prostate with lower urinary tract symptoms: Secondary | ICD-10-CM | POA: Diagnosis not present

## 2020-02-06 DIAGNOSIS — F32A Depression, unspecified: Secondary | ICD-10-CM | POA: Diagnosis not present

## 2020-02-06 DIAGNOSIS — M150 Primary generalized (osteo)arthritis: Secondary | ICD-10-CM | POA: Diagnosis not present

## 2020-02-06 DIAGNOSIS — H547 Unspecified visual loss: Secondary | ICD-10-CM | POA: Diagnosis not present

## 2020-02-06 DIAGNOSIS — S51011D Laceration without foreign body of right elbow, subsequent encounter: Secondary | ICD-10-CM | POA: Diagnosis not present

## 2020-02-06 DIAGNOSIS — N401 Enlarged prostate with lower urinary tract symptoms: Secondary | ICD-10-CM | POA: Diagnosis not present

## 2020-02-06 DIAGNOSIS — R4189 Other symptoms and signs involving cognitive functions and awareness: Secondary | ICD-10-CM | POA: Diagnosis not present

## 2020-02-07 DIAGNOSIS — M150 Primary generalized (osteo)arthritis: Secondary | ICD-10-CM | POA: Diagnosis not present

## 2020-02-07 DIAGNOSIS — S51011D Laceration without foreign body of right elbow, subsequent encounter: Secondary | ICD-10-CM | POA: Diagnosis not present

## 2020-02-07 DIAGNOSIS — R4189 Other symptoms and signs involving cognitive functions and awareness: Secondary | ICD-10-CM | POA: Diagnosis not present

## 2020-02-07 DIAGNOSIS — F32A Depression, unspecified: Secondary | ICD-10-CM | POA: Diagnosis not present

## 2020-02-07 DIAGNOSIS — H547 Unspecified visual loss: Secondary | ICD-10-CM | POA: Diagnosis not present

## 2020-02-07 DIAGNOSIS — N401 Enlarged prostate with lower urinary tract symptoms: Secondary | ICD-10-CM | POA: Diagnosis not present

## 2020-02-08 DIAGNOSIS — R4189 Other symptoms and signs involving cognitive functions and awareness: Secondary | ICD-10-CM | POA: Diagnosis not present

## 2020-02-08 DIAGNOSIS — F32A Depression, unspecified: Secondary | ICD-10-CM | POA: Diagnosis not present

## 2020-02-08 DIAGNOSIS — H547 Unspecified visual loss: Secondary | ICD-10-CM | POA: Diagnosis not present

## 2020-02-08 DIAGNOSIS — S51011D Laceration without foreign body of right elbow, subsequent encounter: Secondary | ICD-10-CM | POA: Diagnosis not present

## 2020-02-08 DIAGNOSIS — N401 Enlarged prostate with lower urinary tract symptoms: Secondary | ICD-10-CM | POA: Diagnosis not present

## 2020-02-08 DIAGNOSIS — M150 Primary generalized (osteo)arthritis: Secondary | ICD-10-CM | POA: Diagnosis not present

## 2020-02-15 DIAGNOSIS — S51011D Laceration without foreign body of right elbow, subsequent encounter: Secondary | ICD-10-CM | POA: Diagnosis not present

## 2020-02-15 DIAGNOSIS — R4189 Other symptoms and signs involving cognitive functions and awareness: Secondary | ICD-10-CM | POA: Diagnosis not present

## 2020-02-15 DIAGNOSIS — H547 Unspecified visual loss: Secondary | ICD-10-CM | POA: Diagnosis not present

## 2020-02-15 DIAGNOSIS — N401 Enlarged prostate with lower urinary tract symptoms: Secondary | ICD-10-CM | POA: Diagnosis not present

## 2020-02-15 DIAGNOSIS — F32A Depression, unspecified: Secondary | ICD-10-CM | POA: Diagnosis not present

## 2020-02-15 DIAGNOSIS — M150 Primary generalized (osteo)arthritis: Secondary | ICD-10-CM | POA: Diagnosis not present

## 2020-02-16 DIAGNOSIS — N400 Enlarged prostate without lower urinary tract symptoms: Secondary | ICD-10-CM | POA: Diagnosis not present

## 2020-02-16 DIAGNOSIS — Z85038 Personal history of other malignant neoplasm of large intestine: Secondary | ICD-10-CM | POA: Diagnosis not present

## 2020-02-16 DIAGNOSIS — F329 Major depressive disorder, single episode, unspecified: Secondary | ICD-10-CM | POA: Diagnosis not present

## 2020-02-16 DIAGNOSIS — Z87438 Personal history of other diseases of male genital organs: Secondary | ICD-10-CM | POA: Diagnosis not present

## 2020-02-17 DIAGNOSIS — R4189 Other symptoms and signs involving cognitive functions and awareness: Secondary | ICD-10-CM | POA: Diagnosis not present

## 2020-02-17 DIAGNOSIS — H547 Unspecified visual loss: Secondary | ICD-10-CM | POA: Diagnosis not present

## 2020-02-17 DIAGNOSIS — F32A Depression, unspecified: Secondary | ICD-10-CM | POA: Diagnosis not present

## 2020-02-17 DIAGNOSIS — M150 Primary generalized (osteo)arthritis: Secondary | ICD-10-CM | POA: Diagnosis not present

## 2020-02-17 DIAGNOSIS — S51011D Laceration without foreign body of right elbow, subsequent encounter: Secondary | ICD-10-CM | POA: Diagnosis not present

## 2020-02-17 DIAGNOSIS — N401 Enlarged prostate with lower urinary tract symptoms: Secondary | ICD-10-CM | POA: Diagnosis not present

## 2020-02-20 DIAGNOSIS — Z85038 Personal history of other malignant neoplasm of large intestine: Secondary | ICD-10-CM | POA: Diagnosis not present

## 2020-02-20 DIAGNOSIS — S51011D Laceration without foreign body of right elbow, subsequent encounter: Secondary | ICD-10-CM | POA: Diagnosis not present

## 2020-02-20 DIAGNOSIS — N401 Enlarged prostate with lower urinary tract symptoms: Secondary | ICD-10-CM | POA: Diagnosis not present

## 2020-02-20 DIAGNOSIS — C189 Malignant neoplasm of colon, unspecified: Secondary | ICD-10-CM | POA: Diagnosis not present

## 2020-02-20 DIAGNOSIS — M150 Primary generalized (osteo)arthritis: Secondary | ICD-10-CM | POA: Diagnosis not present

## 2020-02-20 DIAGNOSIS — R32 Unspecified urinary incontinence: Secondary | ICD-10-CM | POA: Diagnosis not present

## 2020-02-20 DIAGNOSIS — F32A Depression, unspecified: Secondary | ICD-10-CM | POA: Diagnosis not present

## 2020-02-20 DIAGNOSIS — R4189 Other symptoms and signs involving cognitive functions and awareness: Secondary | ICD-10-CM | POA: Diagnosis not present

## 2020-02-20 DIAGNOSIS — Z8551 Personal history of malignant neoplasm of bladder: Secondary | ICD-10-CM | POA: Diagnosis not present

## 2020-02-20 DIAGNOSIS — H547 Unspecified visual loss: Secondary | ICD-10-CM | POA: Diagnosis not present

## 2020-02-20 DIAGNOSIS — M1990 Unspecified osteoarthritis, unspecified site: Secondary | ICD-10-CM | POA: Diagnosis not present

## 2020-02-21 DIAGNOSIS — F32A Depression, unspecified: Secondary | ICD-10-CM | POA: Diagnosis not present

## 2020-02-21 DIAGNOSIS — R4189 Other symptoms and signs involving cognitive functions and awareness: Secondary | ICD-10-CM | POA: Diagnosis not present

## 2020-02-21 DIAGNOSIS — S51011D Laceration without foreign body of right elbow, subsequent encounter: Secondary | ICD-10-CM | POA: Diagnosis not present

## 2020-02-21 DIAGNOSIS — H547 Unspecified visual loss: Secondary | ICD-10-CM | POA: Diagnosis not present

## 2020-02-21 DIAGNOSIS — M150 Primary generalized (osteo)arthritis: Secondary | ICD-10-CM | POA: Diagnosis not present

## 2020-02-21 DIAGNOSIS — N401 Enlarged prostate with lower urinary tract symptoms: Secondary | ICD-10-CM | POA: Diagnosis not present

## 2020-02-22 DIAGNOSIS — H547 Unspecified visual loss: Secondary | ICD-10-CM | POA: Diagnosis not present

## 2020-02-22 DIAGNOSIS — M150 Primary generalized (osteo)arthritis: Secondary | ICD-10-CM | POA: Diagnosis not present

## 2020-02-22 DIAGNOSIS — S51011D Laceration without foreign body of right elbow, subsequent encounter: Secondary | ICD-10-CM | POA: Diagnosis not present

## 2020-02-22 DIAGNOSIS — N401 Enlarged prostate with lower urinary tract symptoms: Secondary | ICD-10-CM | POA: Diagnosis not present

## 2020-02-22 DIAGNOSIS — F32A Depression, unspecified: Secondary | ICD-10-CM | POA: Diagnosis not present

## 2020-02-22 DIAGNOSIS — R4189 Other symptoms and signs involving cognitive functions and awareness: Secondary | ICD-10-CM | POA: Diagnosis not present

## 2020-02-24 DIAGNOSIS — N401 Enlarged prostate with lower urinary tract symptoms: Secondary | ICD-10-CM | POA: Diagnosis not present

## 2020-02-24 DIAGNOSIS — R4189 Other symptoms and signs involving cognitive functions and awareness: Secondary | ICD-10-CM | POA: Diagnosis not present

## 2020-02-24 DIAGNOSIS — F32A Depression, unspecified: Secondary | ICD-10-CM | POA: Diagnosis not present

## 2020-02-24 DIAGNOSIS — H547 Unspecified visual loss: Secondary | ICD-10-CM | POA: Diagnosis not present

## 2020-02-24 DIAGNOSIS — S51011D Laceration without foreign body of right elbow, subsequent encounter: Secondary | ICD-10-CM | POA: Diagnosis not present

## 2020-02-24 DIAGNOSIS — M150 Primary generalized (osteo)arthritis: Secondary | ICD-10-CM | POA: Diagnosis not present

## 2020-02-28 DIAGNOSIS — R278 Other lack of coordination: Secondary | ICD-10-CM | POA: Diagnosis not present

## 2020-02-28 DIAGNOSIS — M6281 Muscle weakness (generalized): Secondary | ICD-10-CM | POA: Diagnosis not present

## 2020-02-28 DIAGNOSIS — Z9181 History of falling: Secondary | ICD-10-CM | POA: Diagnosis not present

## 2020-02-28 DIAGNOSIS — R262 Difficulty in walking, not elsewhere classified: Secondary | ICD-10-CM | POA: Diagnosis not present

## 2020-02-28 DIAGNOSIS — R2681 Unsteadiness on feet: Secondary | ICD-10-CM | POA: Diagnosis not present

## 2020-02-29 DIAGNOSIS — Z9181 History of falling: Secondary | ICD-10-CM | POA: Diagnosis not present

## 2020-02-29 DIAGNOSIS — M6281 Muscle weakness (generalized): Secondary | ICD-10-CM | POA: Diagnosis not present

## 2020-02-29 DIAGNOSIS — R262 Difficulty in walking, not elsewhere classified: Secondary | ICD-10-CM | POA: Diagnosis not present

## 2020-02-29 DIAGNOSIS — R2681 Unsteadiness on feet: Secondary | ICD-10-CM | POA: Diagnosis not present

## 2020-02-29 DIAGNOSIS — R278 Other lack of coordination: Secondary | ICD-10-CM | POA: Diagnosis not present

## 2020-03-01 DIAGNOSIS — Z9181 History of falling: Secondary | ICD-10-CM | POA: Diagnosis not present

## 2020-03-01 DIAGNOSIS — R262 Difficulty in walking, not elsewhere classified: Secondary | ICD-10-CM | POA: Diagnosis not present

## 2020-03-01 DIAGNOSIS — R278 Other lack of coordination: Secondary | ICD-10-CM | POA: Diagnosis not present

## 2020-03-01 DIAGNOSIS — R2681 Unsteadiness on feet: Secondary | ICD-10-CM | POA: Diagnosis not present

## 2020-03-01 DIAGNOSIS — M6281 Muscle weakness (generalized): Secondary | ICD-10-CM | POA: Diagnosis not present

## 2020-03-02 DIAGNOSIS — R262 Difficulty in walking, not elsewhere classified: Secondary | ICD-10-CM | POA: Diagnosis not present

## 2020-03-02 DIAGNOSIS — M6281 Muscle weakness (generalized): Secondary | ICD-10-CM | POA: Diagnosis not present

## 2020-03-02 DIAGNOSIS — Z9181 History of falling: Secondary | ICD-10-CM | POA: Diagnosis not present

## 2020-03-02 DIAGNOSIS — R2681 Unsteadiness on feet: Secondary | ICD-10-CM | POA: Diagnosis not present

## 2020-03-02 DIAGNOSIS — R278 Other lack of coordination: Secondary | ICD-10-CM | POA: Diagnosis not present

## 2020-03-05 DIAGNOSIS — Z9181 History of falling: Secondary | ICD-10-CM | POA: Diagnosis not present

## 2020-03-05 DIAGNOSIS — R262 Difficulty in walking, not elsewhere classified: Secondary | ICD-10-CM | POA: Diagnosis not present

## 2020-03-05 DIAGNOSIS — R2681 Unsteadiness on feet: Secondary | ICD-10-CM | POA: Diagnosis not present

## 2020-03-05 DIAGNOSIS — R278 Other lack of coordination: Secondary | ICD-10-CM | POA: Diagnosis not present

## 2020-03-05 DIAGNOSIS — M6281 Muscle weakness (generalized): Secondary | ICD-10-CM | POA: Diagnosis not present

## 2020-03-06 DIAGNOSIS — R278 Other lack of coordination: Secondary | ICD-10-CM | POA: Diagnosis not present

## 2020-03-06 DIAGNOSIS — Z9181 History of falling: Secondary | ICD-10-CM | POA: Diagnosis not present

## 2020-03-06 DIAGNOSIS — M6281 Muscle weakness (generalized): Secondary | ICD-10-CM | POA: Diagnosis not present

## 2020-03-06 DIAGNOSIS — R262 Difficulty in walking, not elsewhere classified: Secondary | ICD-10-CM | POA: Diagnosis not present

## 2020-03-06 DIAGNOSIS — R2681 Unsteadiness on feet: Secondary | ICD-10-CM | POA: Diagnosis not present

## 2020-03-07 DIAGNOSIS — R262 Difficulty in walking, not elsewhere classified: Secondary | ICD-10-CM | POA: Diagnosis not present

## 2020-03-07 DIAGNOSIS — R2681 Unsteadiness on feet: Secondary | ICD-10-CM | POA: Diagnosis not present

## 2020-03-07 DIAGNOSIS — M6281 Muscle weakness (generalized): Secondary | ICD-10-CM | POA: Diagnosis not present

## 2020-03-07 DIAGNOSIS — R278 Other lack of coordination: Secondary | ICD-10-CM | POA: Diagnosis not present

## 2020-03-07 DIAGNOSIS — Z9181 History of falling: Secondary | ICD-10-CM | POA: Diagnosis not present

## 2020-03-08 DIAGNOSIS — Z9181 History of falling: Secondary | ICD-10-CM | POA: Diagnosis not present

## 2020-03-08 DIAGNOSIS — Z79899 Other long term (current) drug therapy: Secondary | ICD-10-CM | POA: Diagnosis not present

## 2020-03-08 DIAGNOSIS — R2681 Unsteadiness on feet: Secondary | ICD-10-CM | POA: Diagnosis not present

## 2020-03-08 DIAGNOSIS — N400 Enlarged prostate without lower urinary tract symptoms: Secondary | ICD-10-CM | POA: Diagnosis not present

## 2020-03-08 DIAGNOSIS — R278 Other lack of coordination: Secondary | ICD-10-CM | POA: Diagnosis not present

## 2020-03-08 DIAGNOSIS — M6281 Muscle weakness (generalized): Secondary | ICD-10-CM | POA: Diagnosis not present

## 2020-03-08 DIAGNOSIS — R262 Difficulty in walking, not elsewhere classified: Secondary | ICD-10-CM | POA: Diagnosis not present

## 2020-03-13 DIAGNOSIS — M6281 Muscle weakness (generalized): Secondary | ICD-10-CM | POA: Diagnosis not present

## 2020-03-13 DIAGNOSIS — R262 Difficulty in walking, not elsewhere classified: Secondary | ICD-10-CM | POA: Diagnosis not present

## 2020-03-13 DIAGNOSIS — R2681 Unsteadiness on feet: Secondary | ICD-10-CM | POA: Diagnosis not present

## 2020-03-13 DIAGNOSIS — R278 Other lack of coordination: Secondary | ICD-10-CM | POA: Diagnosis not present

## 2020-03-13 DIAGNOSIS — Z9181 History of falling: Secondary | ICD-10-CM | POA: Diagnosis not present

## 2020-03-14 DIAGNOSIS — R2681 Unsteadiness on feet: Secondary | ICD-10-CM | POA: Diagnosis not present

## 2020-03-14 DIAGNOSIS — M6281 Muscle weakness (generalized): Secondary | ICD-10-CM | POA: Diagnosis not present

## 2020-03-14 DIAGNOSIS — R278 Other lack of coordination: Secondary | ICD-10-CM | POA: Diagnosis not present

## 2020-03-14 DIAGNOSIS — R262 Difficulty in walking, not elsewhere classified: Secondary | ICD-10-CM | POA: Diagnosis not present

## 2020-03-14 DIAGNOSIS — Z9181 History of falling: Secondary | ICD-10-CM | POA: Diagnosis not present

## 2020-03-15 DIAGNOSIS — Z9181 History of falling: Secondary | ICD-10-CM | POA: Diagnosis not present

## 2020-03-15 DIAGNOSIS — M6281 Muscle weakness (generalized): Secondary | ICD-10-CM | POA: Diagnosis not present

## 2020-03-15 DIAGNOSIS — R2681 Unsteadiness on feet: Secondary | ICD-10-CM | POA: Diagnosis not present

## 2020-03-15 DIAGNOSIS — R278 Other lack of coordination: Secondary | ICD-10-CM | POA: Diagnosis not present

## 2020-03-15 DIAGNOSIS — R262 Difficulty in walking, not elsewhere classified: Secondary | ICD-10-CM | POA: Diagnosis not present

## 2020-03-20 DIAGNOSIS — Z9181 History of falling: Secondary | ICD-10-CM | POA: Diagnosis not present

## 2020-03-20 DIAGNOSIS — R2681 Unsteadiness on feet: Secondary | ICD-10-CM | POA: Diagnosis not present

## 2020-03-20 DIAGNOSIS — M6281 Muscle weakness (generalized): Secondary | ICD-10-CM | POA: Diagnosis not present

## 2020-03-20 DIAGNOSIS — R278 Other lack of coordination: Secondary | ICD-10-CM | POA: Diagnosis not present

## 2020-03-20 DIAGNOSIS — R262 Difficulty in walking, not elsewhere classified: Secondary | ICD-10-CM | POA: Diagnosis not present

## 2020-03-22 DIAGNOSIS — R278 Other lack of coordination: Secondary | ICD-10-CM | POA: Diagnosis not present

## 2020-03-22 DIAGNOSIS — M6281 Muscle weakness (generalized): Secondary | ICD-10-CM | POA: Diagnosis not present

## 2020-03-22 DIAGNOSIS — R262 Difficulty in walking, not elsewhere classified: Secondary | ICD-10-CM | POA: Diagnosis not present

## 2020-03-22 DIAGNOSIS — Z9181 History of falling: Secondary | ICD-10-CM | POA: Diagnosis not present

## 2020-03-22 DIAGNOSIS — R2681 Unsteadiness on feet: Secondary | ICD-10-CM | POA: Diagnosis not present

## 2020-03-27 DIAGNOSIS — R278 Other lack of coordination: Secondary | ICD-10-CM | POA: Diagnosis not present

## 2020-03-27 DIAGNOSIS — Z9181 History of falling: Secondary | ICD-10-CM | POA: Diagnosis not present

## 2020-03-27 DIAGNOSIS — M6281 Muscle weakness (generalized): Secondary | ICD-10-CM | POA: Diagnosis not present

## 2020-03-27 DIAGNOSIS — R262 Difficulty in walking, not elsewhere classified: Secondary | ICD-10-CM | POA: Diagnosis not present

## 2020-03-27 DIAGNOSIS — R2681 Unsteadiness on feet: Secondary | ICD-10-CM | POA: Diagnosis not present

## 2020-03-28 DIAGNOSIS — R262 Difficulty in walking, not elsewhere classified: Secondary | ICD-10-CM | POA: Diagnosis not present

## 2020-03-28 DIAGNOSIS — R2681 Unsteadiness on feet: Secondary | ICD-10-CM | POA: Diagnosis not present

## 2020-03-28 DIAGNOSIS — R278 Other lack of coordination: Secondary | ICD-10-CM | POA: Diagnosis not present

## 2020-03-28 DIAGNOSIS — Z9181 History of falling: Secondary | ICD-10-CM | POA: Diagnosis not present

## 2020-03-28 DIAGNOSIS — M6281 Muscle weakness (generalized): Secondary | ICD-10-CM | POA: Diagnosis not present

## 2020-03-29 DIAGNOSIS — H8190 Unspecified disorder of vestibular function, unspecified ear: Secondary | ICD-10-CM | POA: Diagnosis not present

## 2020-03-29 DIAGNOSIS — M1990 Unspecified osteoarthritis, unspecified site: Secondary | ICD-10-CM | POA: Diagnosis not present

## 2020-03-29 DIAGNOSIS — F329 Major depressive disorder, single episode, unspecified: Secondary | ICD-10-CM | POA: Diagnosis not present

## 2020-03-29 DIAGNOSIS — N400 Enlarged prostate without lower urinary tract symptoms: Secondary | ICD-10-CM | POA: Diagnosis not present

## 2020-03-30 DIAGNOSIS — R2681 Unsteadiness on feet: Secondary | ICD-10-CM | POA: Diagnosis not present

## 2020-03-30 DIAGNOSIS — R278 Other lack of coordination: Secondary | ICD-10-CM | POA: Diagnosis not present

## 2020-03-30 DIAGNOSIS — M6281 Muscle weakness (generalized): Secondary | ICD-10-CM | POA: Diagnosis not present

## 2020-03-30 DIAGNOSIS — R262 Difficulty in walking, not elsewhere classified: Secondary | ICD-10-CM | POA: Diagnosis not present

## 2020-03-30 DIAGNOSIS — Z9181 History of falling: Secondary | ICD-10-CM | POA: Diagnosis not present

## 2020-04-02 DIAGNOSIS — R2681 Unsteadiness on feet: Secondary | ICD-10-CM | POA: Diagnosis not present

## 2020-04-02 DIAGNOSIS — Z9181 History of falling: Secondary | ICD-10-CM | POA: Diagnosis not present

## 2020-04-02 DIAGNOSIS — R262 Difficulty in walking, not elsewhere classified: Secondary | ICD-10-CM | POA: Diagnosis not present

## 2020-04-02 DIAGNOSIS — M6281 Muscle weakness (generalized): Secondary | ICD-10-CM | POA: Diagnosis not present

## 2020-04-02 DIAGNOSIS — R278 Other lack of coordination: Secondary | ICD-10-CM | POA: Diagnosis not present

## 2020-04-03 DIAGNOSIS — R2681 Unsteadiness on feet: Secondary | ICD-10-CM | POA: Diagnosis not present

## 2020-04-03 DIAGNOSIS — M6281 Muscle weakness (generalized): Secondary | ICD-10-CM | POA: Diagnosis not present

## 2020-04-03 DIAGNOSIS — R278 Other lack of coordination: Secondary | ICD-10-CM | POA: Diagnosis not present

## 2020-04-03 DIAGNOSIS — Z9181 History of falling: Secondary | ICD-10-CM | POA: Diagnosis not present

## 2020-04-03 DIAGNOSIS — R262 Difficulty in walking, not elsewhere classified: Secondary | ICD-10-CM | POA: Diagnosis not present

## 2020-04-04 DIAGNOSIS — M6281 Muscle weakness (generalized): Secondary | ICD-10-CM | POA: Diagnosis not present

## 2020-04-04 DIAGNOSIS — R262 Difficulty in walking, not elsewhere classified: Secondary | ICD-10-CM | POA: Diagnosis not present

## 2020-04-04 DIAGNOSIS — R2681 Unsteadiness on feet: Secondary | ICD-10-CM | POA: Diagnosis not present

## 2020-04-04 DIAGNOSIS — R278 Other lack of coordination: Secondary | ICD-10-CM | POA: Diagnosis not present

## 2020-04-04 DIAGNOSIS — Z9181 History of falling: Secondary | ICD-10-CM | POA: Diagnosis not present

## 2020-04-05 DIAGNOSIS — M6281 Muscle weakness (generalized): Secondary | ICD-10-CM | POA: Diagnosis not present

## 2020-04-05 DIAGNOSIS — R278 Other lack of coordination: Secondary | ICD-10-CM | POA: Diagnosis not present

## 2020-04-05 DIAGNOSIS — R2681 Unsteadiness on feet: Secondary | ICD-10-CM | POA: Diagnosis not present

## 2020-04-05 DIAGNOSIS — Z9181 History of falling: Secondary | ICD-10-CM | POA: Diagnosis not present

## 2020-04-05 DIAGNOSIS — R262 Difficulty in walking, not elsewhere classified: Secondary | ICD-10-CM | POA: Diagnosis not present

## 2020-04-09 DIAGNOSIS — R2681 Unsteadiness on feet: Secondary | ICD-10-CM | POA: Diagnosis not present

## 2020-04-09 DIAGNOSIS — M6281 Muscle weakness (generalized): Secondary | ICD-10-CM | POA: Diagnosis not present

## 2020-04-09 DIAGNOSIS — Z9181 History of falling: Secondary | ICD-10-CM | POA: Diagnosis not present

## 2020-04-09 DIAGNOSIS — R262 Difficulty in walking, not elsewhere classified: Secondary | ICD-10-CM | POA: Diagnosis not present

## 2020-04-09 DIAGNOSIS — R278 Other lack of coordination: Secondary | ICD-10-CM | POA: Diagnosis not present

## 2020-04-10 DIAGNOSIS — R278 Other lack of coordination: Secondary | ICD-10-CM | POA: Diagnosis not present

## 2020-04-10 DIAGNOSIS — R262 Difficulty in walking, not elsewhere classified: Secondary | ICD-10-CM | POA: Diagnosis not present

## 2020-04-10 DIAGNOSIS — M6281 Muscle weakness (generalized): Secondary | ICD-10-CM | POA: Diagnosis not present

## 2020-04-10 DIAGNOSIS — R2681 Unsteadiness on feet: Secondary | ICD-10-CM | POA: Diagnosis not present

## 2020-04-10 DIAGNOSIS — Z9181 History of falling: Secondary | ICD-10-CM | POA: Diagnosis not present

## 2020-04-11 DIAGNOSIS — R262 Difficulty in walking, not elsewhere classified: Secondary | ICD-10-CM | POA: Diagnosis not present

## 2020-04-11 DIAGNOSIS — R278 Other lack of coordination: Secondary | ICD-10-CM | POA: Diagnosis not present

## 2020-04-11 DIAGNOSIS — M6281 Muscle weakness (generalized): Secondary | ICD-10-CM | POA: Diagnosis not present

## 2020-04-11 DIAGNOSIS — Z9181 History of falling: Secondary | ICD-10-CM | POA: Diagnosis not present

## 2020-04-11 DIAGNOSIS — R2681 Unsteadiness on feet: Secondary | ICD-10-CM | POA: Diagnosis not present

## 2020-04-12 DIAGNOSIS — R262 Difficulty in walking, not elsewhere classified: Secondary | ICD-10-CM | POA: Diagnosis not present

## 2020-04-12 DIAGNOSIS — R2681 Unsteadiness on feet: Secondary | ICD-10-CM | POA: Diagnosis not present

## 2020-04-12 DIAGNOSIS — R278 Other lack of coordination: Secondary | ICD-10-CM | POA: Diagnosis not present

## 2020-04-12 DIAGNOSIS — Z9181 History of falling: Secondary | ICD-10-CM | POA: Diagnosis not present

## 2020-04-12 DIAGNOSIS — M6281 Muscle weakness (generalized): Secondary | ICD-10-CM | POA: Diagnosis not present

## 2020-04-16 DIAGNOSIS — H8190 Unspecified disorder of vestibular function, unspecified ear: Secondary | ICD-10-CM | POA: Diagnosis not present

## 2020-04-16 DIAGNOSIS — N400 Enlarged prostate without lower urinary tract symptoms: Secondary | ICD-10-CM | POA: Diagnosis not present

## 2020-04-16 DIAGNOSIS — Z79899 Other long term (current) drug therapy: Secondary | ICD-10-CM | POA: Diagnosis not present

## 2020-04-16 DIAGNOSIS — M1990 Unspecified osteoarthritis, unspecified site: Secondary | ICD-10-CM | POA: Diagnosis not present

## 2020-04-16 DIAGNOSIS — F329 Major depressive disorder, single episode, unspecified: Secondary | ICD-10-CM | POA: Diagnosis not present

## 2020-04-17 DIAGNOSIS — R278 Other lack of coordination: Secondary | ICD-10-CM | POA: Diagnosis not present

## 2020-04-17 DIAGNOSIS — M6281 Muscle weakness (generalized): Secondary | ICD-10-CM | POA: Diagnosis not present

## 2020-04-17 DIAGNOSIS — R2681 Unsteadiness on feet: Secondary | ICD-10-CM | POA: Diagnosis not present

## 2020-04-17 DIAGNOSIS — Z9181 History of falling: Secondary | ICD-10-CM | POA: Diagnosis not present

## 2020-04-17 DIAGNOSIS — R262 Difficulty in walking, not elsewhere classified: Secondary | ICD-10-CM | POA: Diagnosis not present

## 2020-04-19 DIAGNOSIS — R262 Difficulty in walking, not elsewhere classified: Secondary | ICD-10-CM | POA: Diagnosis not present

## 2020-04-19 DIAGNOSIS — Z9181 History of falling: Secondary | ICD-10-CM | POA: Diagnosis not present

## 2020-04-19 DIAGNOSIS — M6281 Muscle weakness (generalized): Secondary | ICD-10-CM | POA: Diagnosis not present

## 2020-04-19 DIAGNOSIS — R278 Other lack of coordination: Secondary | ICD-10-CM | POA: Diagnosis not present

## 2020-04-19 DIAGNOSIS — R2681 Unsteadiness on feet: Secondary | ICD-10-CM | POA: Diagnosis not present

## 2020-04-20 DIAGNOSIS — R2681 Unsteadiness on feet: Secondary | ICD-10-CM | POA: Diagnosis not present

## 2020-04-20 DIAGNOSIS — R278 Other lack of coordination: Secondary | ICD-10-CM | POA: Diagnosis not present

## 2020-04-20 DIAGNOSIS — M6281 Muscle weakness (generalized): Secondary | ICD-10-CM | POA: Diagnosis not present

## 2020-04-20 DIAGNOSIS — Z9181 History of falling: Secondary | ICD-10-CM | POA: Diagnosis not present

## 2020-04-20 DIAGNOSIS — R262 Difficulty in walking, not elsewhere classified: Secondary | ICD-10-CM | POA: Diagnosis not present

## 2020-04-23 DIAGNOSIS — R2681 Unsteadiness on feet: Secondary | ICD-10-CM | POA: Diagnosis not present

## 2020-04-23 DIAGNOSIS — M6281 Muscle weakness (generalized): Secondary | ICD-10-CM | POA: Diagnosis not present

## 2020-04-23 DIAGNOSIS — R262 Difficulty in walking, not elsewhere classified: Secondary | ICD-10-CM | POA: Diagnosis not present

## 2020-04-23 DIAGNOSIS — R278 Other lack of coordination: Secondary | ICD-10-CM | POA: Diagnosis not present

## 2020-04-23 DIAGNOSIS — Z9181 History of falling: Secondary | ICD-10-CM | POA: Diagnosis not present

## 2020-04-24 DIAGNOSIS — Z9181 History of falling: Secondary | ICD-10-CM | POA: Diagnosis not present

## 2020-04-24 DIAGNOSIS — R262 Difficulty in walking, not elsewhere classified: Secondary | ICD-10-CM | POA: Diagnosis not present

## 2020-04-24 DIAGNOSIS — R2681 Unsteadiness on feet: Secondary | ICD-10-CM | POA: Diagnosis not present

## 2020-04-24 DIAGNOSIS — M6281 Muscle weakness (generalized): Secondary | ICD-10-CM | POA: Diagnosis not present

## 2020-04-24 DIAGNOSIS — R278 Other lack of coordination: Secondary | ICD-10-CM | POA: Diagnosis not present

## 2020-04-25 DIAGNOSIS — Z9181 History of falling: Secondary | ICD-10-CM | POA: Diagnosis not present

## 2020-04-25 DIAGNOSIS — M6281 Muscle weakness (generalized): Secondary | ICD-10-CM | POA: Diagnosis not present

## 2020-04-25 DIAGNOSIS — R278 Other lack of coordination: Secondary | ICD-10-CM | POA: Diagnosis not present

## 2020-04-25 DIAGNOSIS — R2681 Unsteadiness on feet: Secondary | ICD-10-CM | POA: Diagnosis not present

## 2020-04-25 DIAGNOSIS — R262 Difficulty in walking, not elsewhere classified: Secondary | ICD-10-CM | POA: Diagnosis not present

## 2020-04-30 DIAGNOSIS — M150 Primary generalized (osteo)arthritis: Secondary | ICD-10-CM | POA: Diagnosis not present

## 2020-04-30 DIAGNOSIS — S51011D Laceration without foreign body of right elbow, subsequent encounter: Secondary | ICD-10-CM | POA: Diagnosis not present

## 2020-04-30 DIAGNOSIS — F32A Depression, unspecified: Secondary | ICD-10-CM | POA: Diagnosis not present

## 2020-05-01 DIAGNOSIS — R2681 Unsteadiness on feet: Secondary | ICD-10-CM | POA: Diagnosis not present

## 2020-05-01 DIAGNOSIS — R262 Difficulty in walking, not elsewhere classified: Secondary | ICD-10-CM | POA: Diagnosis not present

## 2020-05-01 DIAGNOSIS — R278 Other lack of coordination: Secondary | ICD-10-CM | POA: Diagnosis not present

## 2020-05-01 DIAGNOSIS — Z9181 History of falling: Secondary | ICD-10-CM | POA: Diagnosis not present

## 2020-05-01 DIAGNOSIS — M6281 Muscle weakness (generalized): Secondary | ICD-10-CM | POA: Diagnosis not present

## 2020-05-03 DIAGNOSIS — R278 Other lack of coordination: Secondary | ICD-10-CM | POA: Diagnosis not present

## 2020-05-03 DIAGNOSIS — R2681 Unsteadiness on feet: Secondary | ICD-10-CM | POA: Diagnosis not present

## 2020-05-03 DIAGNOSIS — R062 Wheezing: Secondary | ICD-10-CM | POA: Diagnosis not present

## 2020-05-03 DIAGNOSIS — M6281 Muscle weakness (generalized): Secondary | ICD-10-CM | POA: Diagnosis not present

## 2020-05-03 DIAGNOSIS — R0989 Other specified symptoms and signs involving the circulatory and respiratory systems: Secondary | ICD-10-CM | POA: Diagnosis not present

## 2020-05-03 DIAGNOSIS — Z9181 History of falling: Secondary | ICD-10-CM | POA: Diagnosis not present

## 2020-05-03 DIAGNOSIS — R262 Difficulty in walking, not elsewhere classified: Secondary | ICD-10-CM | POA: Diagnosis not present

## 2020-05-07 DIAGNOSIS — F329 Major depressive disorder, single episode, unspecified: Secondary | ICD-10-CM | POA: Diagnosis not present

## 2020-05-07 DIAGNOSIS — R062 Wheezing: Secondary | ICD-10-CM | POA: Diagnosis not present

## 2020-05-07 DIAGNOSIS — Z79899 Other long term (current) drug therapy: Secondary | ICD-10-CM | POA: Diagnosis not present

## 2020-05-07 DIAGNOSIS — M1990 Unspecified osteoarthritis, unspecified site: Secondary | ICD-10-CM | POA: Diagnosis not present

## 2020-05-08 DIAGNOSIS — Z9181 History of falling: Secondary | ICD-10-CM | POA: Diagnosis not present

## 2020-05-08 DIAGNOSIS — R278 Other lack of coordination: Secondary | ICD-10-CM | POA: Diagnosis not present

## 2020-05-08 DIAGNOSIS — R2681 Unsteadiness on feet: Secondary | ICD-10-CM | POA: Diagnosis not present

## 2020-05-08 DIAGNOSIS — M6281 Muscle weakness (generalized): Secondary | ICD-10-CM | POA: Diagnosis not present

## 2020-05-08 DIAGNOSIS — R262 Difficulty in walking, not elsewhere classified: Secondary | ICD-10-CM | POA: Diagnosis not present

## 2020-05-10 DIAGNOSIS — Z9181 History of falling: Secondary | ICD-10-CM | POA: Diagnosis not present

## 2020-05-10 DIAGNOSIS — R262 Difficulty in walking, not elsewhere classified: Secondary | ICD-10-CM | POA: Diagnosis not present

## 2020-05-10 DIAGNOSIS — R2681 Unsteadiness on feet: Secondary | ICD-10-CM | POA: Diagnosis not present

## 2020-05-10 DIAGNOSIS — M6281 Muscle weakness (generalized): Secondary | ICD-10-CM | POA: Diagnosis not present

## 2020-05-10 DIAGNOSIS — R278 Other lack of coordination: Secondary | ICD-10-CM | POA: Diagnosis not present

## 2020-05-14 DIAGNOSIS — R262 Difficulty in walking, not elsewhere classified: Secondary | ICD-10-CM | POA: Diagnosis not present

## 2020-05-14 DIAGNOSIS — Z9181 History of falling: Secondary | ICD-10-CM | POA: Diagnosis not present

## 2020-05-14 DIAGNOSIS — M6281 Muscle weakness (generalized): Secondary | ICD-10-CM | POA: Diagnosis not present

## 2020-05-14 DIAGNOSIS — R278 Other lack of coordination: Secondary | ICD-10-CM | POA: Diagnosis not present

## 2020-05-14 DIAGNOSIS — R2681 Unsteadiness on feet: Secondary | ICD-10-CM | POA: Diagnosis not present

## 2020-05-17 DIAGNOSIS — M6281 Muscle weakness (generalized): Secondary | ICD-10-CM | POA: Diagnosis not present

## 2020-05-17 DIAGNOSIS — R1312 Dysphagia, oropharyngeal phase: Secondary | ICD-10-CM | POA: Diagnosis not present

## 2020-05-17 DIAGNOSIS — Z9181 History of falling: Secondary | ICD-10-CM | POA: Diagnosis not present

## 2020-05-17 DIAGNOSIS — R2681 Unsteadiness on feet: Secondary | ICD-10-CM | POA: Diagnosis not present

## 2020-05-17 DIAGNOSIS — R262 Difficulty in walking, not elsewhere classified: Secondary | ICD-10-CM | POA: Diagnosis not present

## 2020-05-21 DIAGNOSIS — M6281 Muscle weakness (generalized): Secondary | ICD-10-CM | POA: Diagnosis not present

## 2020-05-21 DIAGNOSIS — R262 Difficulty in walking, not elsewhere classified: Secondary | ICD-10-CM | POA: Diagnosis not present

## 2020-05-21 DIAGNOSIS — Z9181 History of falling: Secondary | ICD-10-CM | POA: Diagnosis not present

## 2020-05-21 DIAGNOSIS — R1312 Dysphagia, oropharyngeal phase: Secondary | ICD-10-CM | POA: Diagnosis not present

## 2020-05-21 DIAGNOSIS — R2681 Unsteadiness on feet: Secondary | ICD-10-CM | POA: Diagnosis not present

## 2020-05-23 DIAGNOSIS — Z9181 History of falling: Secondary | ICD-10-CM | POA: Diagnosis not present

## 2020-05-23 DIAGNOSIS — M6281 Muscle weakness (generalized): Secondary | ICD-10-CM | POA: Diagnosis not present

## 2020-05-23 DIAGNOSIS — R262 Difficulty in walking, not elsewhere classified: Secondary | ICD-10-CM | POA: Diagnosis not present

## 2020-05-23 DIAGNOSIS — R2681 Unsteadiness on feet: Secondary | ICD-10-CM | POA: Diagnosis not present

## 2020-05-23 DIAGNOSIS — R1312 Dysphagia, oropharyngeal phase: Secondary | ICD-10-CM | POA: Diagnosis not present

## 2020-05-24 DIAGNOSIS — Z9181 History of falling: Secondary | ICD-10-CM | POA: Diagnosis not present

## 2020-05-24 DIAGNOSIS — Z20828 Contact with and (suspected) exposure to other viral communicable diseases: Secondary | ICD-10-CM | POA: Diagnosis not present

## 2020-05-24 DIAGNOSIS — Z1159 Encounter for screening for other viral diseases: Secondary | ICD-10-CM | POA: Diagnosis not present

## 2020-05-24 DIAGNOSIS — M6281 Muscle weakness (generalized): Secondary | ICD-10-CM | POA: Diagnosis not present

## 2020-05-24 DIAGNOSIS — R1312 Dysphagia, oropharyngeal phase: Secondary | ICD-10-CM | POA: Diagnosis not present

## 2020-05-24 DIAGNOSIS — R262 Difficulty in walking, not elsewhere classified: Secondary | ICD-10-CM | POA: Diagnosis not present

## 2020-05-24 DIAGNOSIS — R2681 Unsteadiness on feet: Secondary | ICD-10-CM | POA: Diagnosis not present

## 2020-05-28 DIAGNOSIS — R1312 Dysphagia, oropharyngeal phase: Secondary | ICD-10-CM | POA: Diagnosis not present

## 2020-05-28 DIAGNOSIS — F329 Major depressive disorder, single episode, unspecified: Secondary | ICD-10-CM | POA: Diagnosis not present

## 2020-05-28 DIAGNOSIS — L304 Erythema intertrigo: Secondary | ICD-10-CM | POA: Diagnosis not present

## 2020-05-28 DIAGNOSIS — Z79899 Other long term (current) drug therapy: Secondary | ICD-10-CM | POA: Diagnosis not present

## 2020-05-29 DIAGNOSIS — Z9181 History of falling: Secondary | ICD-10-CM | POA: Diagnosis not present

## 2020-05-29 DIAGNOSIS — R262 Difficulty in walking, not elsewhere classified: Secondary | ICD-10-CM | POA: Diagnosis not present

## 2020-05-29 DIAGNOSIS — R1312 Dysphagia, oropharyngeal phase: Secondary | ICD-10-CM | POA: Diagnosis not present

## 2020-05-29 DIAGNOSIS — M6281 Muscle weakness (generalized): Secondary | ICD-10-CM | POA: Diagnosis not present

## 2020-05-29 DIAGNOSIS — R2681 Unsteadiness on feet: Secondary | ICD-10-CM | POA: Diagnosis not present

## 2020-05-31 DIAGNOSIS — R1312 Dysphagia, oropharyngeal phase: Secondary | ICD-10-CM | POA: Diagnosis not present

## 2020-05-31 DIAGNOSIS — M6281 Muscle weakness (generalized): Secondary | ICD-10-CM | POA: Diagnosis not present

## 2020-05-31 DIAGNOSIS — Z20828 Contact with and (suspected) exposure to other viral communicable diseases: Secondary | ICD-10-CM | POA: Diagnosis not present

## 2020-05-31 DIAGNOSIS — Z9181 History of falling: Secondary | ICD-10-CM | POA: Diagnosis not present

## 2020-05-31 DIAGNOSIS — R2681 Unsteadiness on feet: Secondary | ICD-10-CM | POA: Diagnosis not present

## 2020-05-31 DIAGNOSIS — R262 Difficulty in walking, not elsewhere classified: Secondary | ICD-10-CM | POA: Diagnosis not present

## 2020-05-31 DIAGNOSIS — Z1159 Encounter for screening for other viral diseases: Secondary | ICD-10-CM | POA: Diagnosis not present

## 2020-06-04 DIAGNOSIS — Z20828 Contact with and (suspected) exposure to other viral communicable diseases: Secondary | ICD-10-CM | POA: Diagnosis not present

## 2020-06-04 DIAGNOSIS — Z79899 Other long term (current) drug therapy: Secondary | ICD-10-CM | POA: Diagnosis not present

## 2020-06-04 DIAGNOSIS — L304 Erythema intertrigo: Secondary | ICD-10-CM | POA: Diagnosis not present

## 2020-06-04 DIAGNOSIS — M159 Polyosteoarthritis, unspecified: Secondary | ICD-10-CM | POA: Diagnosis not present

## 2020-06-04 DIAGNOSIS — Z1159 Encounter for screening for other viral diseases: Secondary | ICD-10-CM | POA: Diagnosis not present

## 2020-06-04 DIAGNOSIS — F0280 Dementia in other diseases classified elsewhere without behavioral disturbance: Secondary | ICD-10-CM | POA: Diagnosis not present

## 2020-06-04 DIAGNOSIS — G309 Alzheimer's disease, unspecified: Secondary | ICD-10-CM | POA: Diagnosis not present

## 2020-06-05 DIAGNOSIS — R1312 Dysphagia, oropharyngeal phase: Secondary | ICD-10-CM | POA: Diagnosis not present

## 2020-06-05 DIAGNOSIS — R2681 Unsteadiness on feet: Secondary | ICD-10-CM | POA: Diagnosis not present

## 2020-06-05 DIAGNOSIS — Z9181 History of falling: Secondary | ICD-10-CM | POA: Diagnosis not present

## 2020-06-05 DIAGNOSIS — M6281 Muscle weakness (generalized): Secondary | ICD-10-CM | POA: Diagnosis not present

## 2020-06-05 DIAGNOSIS — R262 Difficulty in walking, not elsewhere classified: Secondary | ICD-10-CM | POA: Diagnosis not present

## 2020-06-07 DIAGNOSIS — Z20828 Contact with and (suspected) exposure to other viral communicable diseases: Secondary | ICD-10-CM | POA: Diagnosis not present

## 2020-06-07 DIAGNOSIS — R262 Difficulty in walking, not elsewhere classified: Secondary | ICD-10-CM | POA: Diagnosis not present

## 2020-06-07 DIAGNOSIS — Z9181 History of falling: Secondary | ICD-10-CM | POA: Diagnosis not present

## 2020-06-07 DIAGNOSIS — R1312 Dysphagia, oropharyngeal phase: Secondary | ICD-10-CM | POA: Diagnosis not present

## 2020-06-07 DIAGNOSIS — R2681 Unsteadiness on feet: Secondary | ICD-10-CM | POA: Diagnosis not present

## 2020-06-07 DIAGNOSIS — Z1159 Encounter for screening for other viral diseases: Secondary | ICD-10-CM | POA: Diagnosis not present

## 2020-06-07 DIAGNOSIS — M6281 Muscle weakness (generalized): Secondary | ICD-10-CM | POA: Diagnosis not present

## 2020-06-08 DIAGNOSIS — R1312 Dysphagia, oropharyngeal phase: Secondary | ICD-10-CM | POA: Diagnosis not present

## 2020-06-08 DIAGNOSIS — Z9181 History of falling: Secondary | ICD-10-CM | POA: Diagnosis not present

## 2020-06-08 DIAGNOSIS — R262 Difficulty in walking, not elsewhere classified: Secondary | ICD-10-CM | POA: Diagnosis not present

## 2020-06-08 DIAGNOSIS — M6281 Muscle weakness (generalized): Secondary | ICD-10-CM | POA: Diagnosis not present

## 2020-06-08 DIAGNOSIS — R2681 Unsteadiness on feet: Secondary | ICD-10-CM | POA: Diagnosis not present

## 2020-06-11 DIAGNOSIS — Z20828 Contact with and (suspected) exposure to other viral communicable diseases: Secondary | ICD-10-CM | POA: Diagnosis not present

## 2020-06-11 DIAGNOSIS — Z1159 Encounter for screening for other viral diseases: Secondary | ICD-10-CM | POA: Diagnosis not present

## 2020-06-12 DIAGNOSIS — R2681 Unsteadiness on feet: Secondary | ICD-10-CM | POA: Diagnosis not present

## 2020-06-12 DIAGNOSIS — M6281 Muscle weakness (generalized): Secondary | ICD-10-CM | POA: Diagnosis not present

## 2020-06-12 DIAGNOSIS — R1312 Dysphagia, oropharyngeal phase: Secondary | ICD-10-CM | POA: Diagnosis not present

## 2020-06-12 DIAGNOSIS — Z9181 History of falling: Secondary | ICD-10-CM | POA: Diagnosis not present

## 2020-06-12 DIAGNOSIS — R262 Difficulty in walking, not elsewhere classified: Secondary | ICD-10-CM | POA: Diagnosis not present

## 2020-06-13 DIAGNOSIS — M6281 Muscle weakness (generalized): Secondary | ICD-10-CM | POA: Diagnosis not present

## 2020-06-13 DIAGNOSIS — R262 Difficulty in walking, not elsewhere classified: Secondary | ICD-10-CM | POA: Diagnosis not present

## 2020-06-13 DIAGNOSIS — Z9181 History of falling: Secondary | ICD-10-CM | POA: Diagnosis not present

## 2020-06-13 DIAGNOSIS — R1312 Dysphagia, oropharyngeal phase: Secondary | ICD-10-CM | POA: Diagnosis not present

## 2020-06-13 DIAGNOSIS — R2681 Unsteadiness on feet: Secondary | ICD-10-CM | POA: Diagnosis not present

## 2020-06-18 DIAGNOSIS — Z1159 Encounter for screening for other viral diseases: Secondary | ICD-10-CM | POA: Diagnosis not present

## 2020-06-18 DIAGNOSIS — Z20828 Contact with and (suspected) exposure to other viral communicable diseases: Secondary | ICD-10-CM | POA: Diagnosis not present

## 2020-06-18 DIAGNOSIS — R1312 Dysphagia, oropharyngeal phase: Secondary | ICD-10-CM | POA: Diagnosis not present

## 2020-06-21 DIAGNOSIS — R1312 Dysphagia, oropharyngeal phase: Secondary | ICD-10-CM | POA: Diagnosis not present

## 2020-06-25 DIAGNOSIS — Z20828 Contact with and (suspected) exposure to other viral communicable diseases: Secondary | ICD-10-CM | POA: Diagnosis not present

## 2020-06-25 DIAGNOSIS — Z1159 Encounter for screening for other viral diseases: Secondary | ICD-10-CM | POA: Diagnosis not present

## 2020-06-25 DIAGNOSIS — R1312 Dysphagia, oropharyngeal phase: Secondary | ICD-10-CM | POA: Diagnosis not present

## 2020-06-26 DIAGNOSIS — R1312 Dysphagia, oropharyngeal phase: Secondary | ICD-10-CM | POA: Diagnosis not present

## 2020-06-28 DIAGNOSIS — Z1159 Encounter for screening for other viral diseases: Secondary | ICD-10-CM | POA: Diagnosis not present

## 2020-06-28 DIAGNOSIS — Z20828 Contact with and (suspected) exposure to other viral communicable diseases: Secondary | ICD-10-CM | POA: Diagnosis not present

## 2020-07-02 DIAGNOSIS — Z1159 Encounter for screening for other viral diseases: Secondary | ICD-10-CM | POA: Diagnosis not present

## 2020-07-02 DIAGNOSIS — Z20828 Contact with and (suspected) exposure to other viral communicable diseases: Secondary | ICD-10-CM | POA: Diagnosis not present

## 2020-07-05 DIAGNOSIS — Z20828 Contact with and (suspected) exposure to other viral communicable diseases: Secondary | ICD-10-CM | POA: Diagnosis not present

## 2020-07-05 DIAGNOSIS — Z1159 Encounter for screening for other viral diseases: Secondary | ICD-10-CM | POA: Diagnosis not present

## 2020-07-09 DIAGNOSIS — Z20828 Contact with and (suspected) exposure to other viral communicable diseases: Secondary | ICD-10-CM | POA: Diagnosis not present

## 2020-07-09 DIAGNOSIS — Z1159 Encounter for screening for other viral diseases: Secondary | ICD-10-CM | POA: Diagnosis not present

## 2020-07-11 DIAGNOSIS — R1312 Dysphagia, oropharyngeal phase: Secondary | ICD-10-CM | POA: Diagnosis not present

## 2020-07-16 DIAGNOSIS — F0280 Dementia in other diseases classified elsewhere without behavioral disturbance: Secondary | ICD-10-CM | POA: Diagnosis not present

## 2020-07-16 DIAGNOSIS — Z1159 Encounter for screening for other viral diseases: Secondary | ICD-10-CM | POA: Diagnosis not present

## 2020-07-16 DIAGNOSIS — M159 Polyosteoarthritis, unspecified: Secondary | ICD-10-CM | POA: Diagnosis not present

## 2020-07-16 DIAGNOSIS — Z79899 Other long term (current) drug therapy: Secondary | ICD-10-CM | POA: Diagnosis not present

## 2020-07-16 DIAGNOSIS — Z20828 Contact with and (suspected) exposure to other viral communicable diseases: Secondary | ICD-10-CM | POA: Diagnosis not present

## 2020-07-16 DIAGNOSIS — R197 Diarrhea, unspecified: Secondary | ICD-10-CM | POA: Diagnosis not present

## 2020-07-16 DIAGNOSIS — G309 Alzheimer's disease, unspecified: Secondary | ICD-10-CM | POA: Diagnosis not present

## 2020-07-19 DIAGNOSIS — Z1159 Encounter for screening for other viral diseases: Secondary | ICD-10-CM | POA: Diagnosis not present

## 2020-07-19 DIAGNOSIS — Z20828 Contact with and (suspected) exposure to other viral communicable diseases: Secondary | ICD-10-CM | POA: Diagnosis not present

## 2020-07-23 DIAGNOSIS — Z1159 Encounter for screening for other viral diseases: Secondary | ICD-10-CM | POA: Diagnosis not present

## 2020-07-23 DIAGNOSIS — Z20828 Contact with and (suspected) exposure to other viral communicable diseases: Secondary | ICD-10-CM | POA: Diagnosis not present

## 2020-07-26 DIAGNOSIS — Z1159 Encounter for screening for other viral diseases: Secondary | ICD-10-CM | POA: Diagnosis not present

## 2020-07-26 DIAGNOSIS — Z20828 Contact with and (suspected) exposure to other viral communicable diseases: Secondary | ICD-10-CM | POA: Diagnosis not present

## 2020-07-30 DIAGNOSIS — K649 Unspecified hemorrhoids: Secondary | ICD-10-CM | POA: Diagnosis not present

## 2020-07-30 DIAGNOSIS — F0280 Dementia in other diseases classified elsewhere without behavioral disturbance: Secondary | ICD-10-CM | POA: Diagnosis not present

## 2020-07-30 DIAGNOSIS — Z20828 Contact with and (suspected) exposure to other viral communicable diseases: Secondary | ICD-10-CM | POA: Diagnosis not present

## 2020-07-30 DIAGNOSIS — F331 Major depressive disorder, recurrent, moderate: Secondary | ICD-10-CM | POA: Diagnosis not present

## 2020-07-30 DIAGNOSIS — Z79899 Other long term (current) drug therapy: Secondary | ICD-10-CM | POA: Diagnosis not present

## 2020-07-30 DIAGNOSIS — Z1159 Encounter for screening for other viral diseases: Secondary | ICD-10-CM | POA: Diagnosis not present

## 2020-07-30 DIAGNOSIS — G309 Alzheimer's disease, unspecified: Secondary | ICD-10-CM | POA: Diagnosis not present

## 2020-08-02 DIAGNOSIS — Z1159 Encounter for screening for other viral diseases: Secondary | ICD-10-CM | POA: Diagnosis not present

## 2020-08-02 DIAGNOSIS — Z20828 Contact with and (suspected) exposure to other viral communicable diseases: Secondary | ICD-10-CM | POA: Diagnosis not present

## 2020-08-06 DIAGNOSIS — Z20828 Contact with and (suspected) exposure to other viral communicable diseases: Secondary | ICD-10-CM | POA: Diagnosis not present

## 2020-08-06 DIAGNOSIS — Z1159 Encounter for screening for other viral diseases: Secondary | ICD-10-CM | POA: Diagnosis not present

## 2020-08-09 DIAGNOSIS — Z20828 Contact with and (suspected) exposure to other viral communicable diseases: Secondary | ICD-10-CM | POA: Diagnosis not present

## 2020-08-09 DIAGNOSIS — Z1159 Encounter for screening for other viral diseases: Secondary | ICD-10-CM | POA: Diagnosis not present

## 2020-08-13 DIAGNOSIS — Z1159 Encounter for screening for other viral diseases: Secondary | ICD-10-CM | POA: Diagnosis not present

## 2020-08-13 DIAGNOSIS — Z20828 Contact with and (suspected) exposure to other viral communicable diseases: Secondary | ICD-10-CM | POA: Diagnosis not present

## 2020-08-16 DIAGNOSIS — Z1159 Encounter for screening for other viral diseases: Secondary | ICD-10-CM | POA: Diagnosis not present

## 2020-08-16 DIAGNOSIS — Z20828 Contact with and (suspected) exposure to other viral communicable diseases: Secondary | ICD-10-CM | POA: Diagnosis not present

## 2020-08-23 DIAGNOSIS — Z79899 Other long term (current) drug therapy: Secondary | ICD-10-CM | POA: Diagnosis not present

## 2020-08-23 DIAGNOSIS — Z20828 Contact with and (suspected) exposure to other viral communicable diseases: Secondary | ICD-10-CM | POA: Diagnosis not present

## 2020-08-23 DIAGNOSIS — Z515 Encounter for palliative care: Secondary | ICD-10-CM | POA: Diagnosis not present

## 2020-08-23 DIAGNOSIS — G309 Alzheimer's disease, unspecified: Secondary | ICD-10-CM | POA: Diagnosis not present

## 2020-08-23 DIAGNOSIS — Z1159 Encounter for screening for other viral diseases: Secondary | ICD-10-CM | POA: Diagnosis not present

## 2020-08-23 DIAGNOSIS — F331 Major depressive disorder, recurrent, moderate: Secondary | ICD-10-CM | POA: Diagnosis not present

## 2020-08-23 DIAGNOSIS — F0280 Dementia in other diseases classified elsewhere without behavioral disturbance: Secondary | ICD-10-CM | POA: Diagnosis not present

## 2020-08-23 DIAGNOSIS — R627 Adult failure to thrive: Secondary | ICD-10-CM | POA: Diagnosis not present

## 2020-08-23 DIAGNOSIS — Z7189 Other specified counseling: Secondary | ICD-10-CM | POA: Diagnosis not present

## 2020-08-26 ENCOUNTER — Other Ambulatory Visit: Payer: Self-pay

## 2020-08-26 ENCOUNTER — Emergency Department (HOSPITAL_COMMUNITY): Payer: Medicare Other

## 2020-08-26 ENCOUNTER — Emergency Department (HOSPITAL_COMMUNITY)
Admission: EM | Admit: 2020-08-26 | Discharge: 2020-09-14 | Disposition: E | Payer: Medicare Other | Attending: Emergency Medicine | Admitting: Emergency Medicine

## 2020-08-26 ENCOUNTER — Encounter (HOSPITAL_COMMUNITY): Payer: Self-pay | Admitting: Emergency Medicine

## 2020-08-26 DIAGNOSIS — J9601 Acute respiratory failure with hypoxia: Secondary | ICD-10-CM | POA: Diagnosis not present

## 2020-08-26 DIAGNOSIS — R000 Tachycardia, unspecified: Secondary | ICD-10-CM | POA: Diagnosis not present

## 2020-08-26 DIAGNOSIS — Z87891 Personal history of nicotine dependence: Secondary | ICD-10-CM | POA: Insufficient documentation

## 2020-08-26 DIAGNOSIS — R0603 Acute respiratory distress: Secondary | ICD-10-CM | POA: Diagnosis present

## 2020-08-26 DIAGNOSIS — R0689 Other abnormalities of breathing: Secondary | ICD-10-CM | POA: Diagnosis not present

## 2020-08-26 DIAGNOSIS — I4891 Unspecified atrial fibrillation: Secondary | ICD-10-CM | POA: Diagnosis not present

## 2020-08-26 DIAGNOSIS — Z85038 Personal history of other malignant neoplasm of large intestine: Secondary | ICD-10-CM | POA: Insufficient documentation

## 2020-08-26 DIAGNOSIS — R0602 Shortness of breath: Secondary | ICD-10-CM | POA: Diagnosis not present

## 2020-08-26 DIAGNOSIS — Z515 Encounter for palliative care: Secondary | ICD-10-CM | POA: Diagnosis not present

## 2020-08-26 DIAGNOSIS — J80 Acute respiratory distress syndrome: Secondary | ICD-10-CM | POA: Diagnosis not present

## 2020-08-26 DIAGNOSIS — R0902 Hypoxemia: Secondary | ICD-10-CM | POA: Diagnosis not present

## 2020-08-26 LAB — BLOOD GAS, ARTERIAL
Acid-base deficit: 6 mmol/L — ABNORMAL HIGH (ref 0.0–2.0)
Bicarbonate: 20.7 mmol/L (ref 20.0–28.0)
Delivery systems: POSITIVE
Drawn by: 308601
Expiratory PAP: 6
FIO2: 100
Inspiratory PAP: 12
O2 Saturation: 95 %
Patient temperature: 98.6
pCO2 arterial: 46.7 mmHg (ref 32.0–48.0)
pH, Arterial: 7.268 — ABNORMAL LOW (ref 7.350–7.450)
pO2, Arterial: 94.6 mmHg (ref 83.0–108.0)

## 2020-08-26 MED ORDER — METRONIDAZOLE 500 MG/100ML IV SOLN: 500.0000 mg | Freq: Once | INTRAVENOUS | Status: DC

## 2020-08-26 MED ORDER — SODIUM CHLORIDE 0.9 % IV SOLN: 2.0000 g | Freq: Once | INTRAVENOUS | Status: DC

## 2020-08-26 MED ORDER — VANCOMYCIN HCL IN DEXTROSE 1-5 GM/200ML-% IV SOLN: 1000.0000 mg | Freq: Once | INTRAVENOUS | Status: DC

## 2020-08-26 MED ORDER — MORPHINE SULFATE (PF) 4 MG/ML IV SOLN
4.0000 mg | Freq: Once | INTRAVENOUS | Status: AC
  Administered 2020-08-26: 4 mg via INTRAVENOUS
  Filled 2020-08-26: qty 1

## 2020-09-14 NOTE — Progress Notes (Signed)
Code Sepsis monitoring discontinued due to cancellation.   DC for comfort care

## 2020-09-14 NOTE — Progress Notes (Signed)
Elink following for sepsis

## 2020-09-14 NOTE — ED Provider Notes (Signed)
I provided a substantive portion of the care of this patient.  I personally performed the entirety of the history, exam, and medical decision making for this encounter. Patient seen by me along with physician assistant.  Patient brought in from [place].  Patient is a DNR.  Patient and significant respiratory distress significantly tachycardic.  Not hypotensive.  Patient was placed on BiPAP with contacted family.  We were initiating broad-spectrum antibiotics for possible sepsis.  Patient's health decision maker is his daughter who is in [place].  Stated that patient did not want to be sent into the ED they had worked out plans for him being moved to hospice and and proximately about a week ago he stated that he had not been feeling well and that he was ready to go.  Family was expecting him to pass away at the nursing facility.  Based on that patient will be placed on comfort care.  We will DC the BiPAP as per request of family.  And will provide some morphine.  Patient is in significant extremis.  Suspected that he will pass away shortly.  If he does not pass away promptly we will go ahead and contact hospitalist for admission or perhaps returning him back to nursing facility.      Fredia Sorrow, MD Sep 20, 2020 1057

## 2020-09-14 NOTE — ED Triage Notes (Signed)
BIBA from [facility] - with c/o resp distress. Staff called for SOB and reports out declining over last 2 weeks Ems reports a&O uncontrolled a-fib with rate of 170 and full of fluid. 20 mg of Cardizem given. Ems also reports became altered so placed on non-rebreather at 41mL -  sats were still in the mid 80's. Pt is a DNR.   Vitals:  71% room air Low 80's on CPAP 7.5 mm 140 palpated 26 RR HR - 150-170 97.9  20 G Left Hand

## 2020-09-14 NOTE — Progress Notes (Signed)
Pt removed from BIPAP per family wishes, pt now comfort care.

## 2020-09-14 NOTE — ED Notes (Signed)
[employer] contacted, referral number [phone]

## 2020-09-14 NOTE — ED Notes (Signed)
Pt's daughter Joycelyn Schmid stated she would be in town tonight to pick up pt belongings.

## 2020-09-14 NOTE — ED Provider Notes (Signed)
[facility] DEPT Provider Note   CSN: [id number] Arrival date & time: September 15, 2020  3559     History Chief Complaint  Patient presents with   Respiratory Distress    Ricky Nguyen is a 87 y.o. male.  HPI 87 year old male with a history of BPH, colon cancer, depression to the ER with respiratory distress.  Level 5 caveat secondary to patient having difficulty speaking.  Patient presents from [facility] facility.  Reportedly has been declining over the last several weeks, with development of respiratory distress today.  I spoke with the patient's daughter Ricky Nguyen who is his power of attorney.  She reports that the patient 2 weeks ago expresses desires to end his life and passed away peacefully in his facility.  She reports that he stopped eating, and was just drinking fluids.  He made it very clear that he wanted to die in the comfort of his bed.  Reportedly developed some respiratory distress today, and [facility] called EMS.  He did arrive with DNR/DNI paperwork.    Past Medical History:  Diagnosis Date   Bladder tumor    BPH (benign prostatic hyperplasia)    Cancer ([place])    Colon   Depression     Patient Active Problem List   Diagnosis Date Noted   Obesity 01/15/2017   Bladder tumor 01/15/2017   Colon cancer ([place]) 01/15/2017   Depression 01/15/2017   Displacement of cervical intervertebral disc without myelopathy 01/15/2017   Displacement of lumbar intervertebral disc without myelopathy 01/15/2017   Hematochezia 01/15/2017   Hemorrhage of rectum and anus 01/15/2017   Insomnia, unspecified 01/15/2017   Pain in joint, pelvic region and thigh 01/15/2017   Pain in joint, shoulder region 01/15/2017   Peripheral neuropathy 01/15/2017   Routine general medical examination at health care facility 01/15/2017   Sciatica 01/15/2017   Anemia, unspecified 01/15/2017   Constipation, unspecified 01/15/2017   Abnormal computed tomography of  gastrointestinal tract 01/15/2017   Abdominal pain, right lower quadrant 03/01/2013    Past Surgical History:  Procedure Laterality Date   aortic ileac bypsss     COLON SURGERY     Colorectal cancer   HERNIA REPAIR     RIH repair/incarcerated   TRANSURETHRAL RESECTION OF PROSTATE  06/2003       No family history on file.  Social History   Tobacco Use   Smoking status: Never   Smokeless tobacco: Former  Substance Use Topics   Alcohol use: Yes    Alcohol/week: 1.0 standard drink    Types: 1 Glasses of wine per week    Comment: daily    Drug use: No    Home Medications Prior to Admission medications   Medication Sig Start Date End Date Taking? Authorizing Provider  meclizine (ANTIVERT) 25 MG tablet Take 1 tablet (25 mg total) by mouth 3 (three) times daily as needed for dizziness. Patient not taking: Reported on 01/15/2017 08/06/16   Varney Biles, MD  sertraline (ZOLOFT) 25 MG tablet Take 25 mg by mouth daily.  08/17/19   [provider]    Allergies    Patient has no known allergies.  Review of Systems   Review of Systems  Unable to perform ROS: Severe respiratory distress  Eyes:  Negative for redness.   Physical Exam Updated Vital Signs BP (!) 145/123   Pulse (!) 150   Temp 99 F (37.2 C) (Axillary)   Resp (!) 31   Ht 5\' 8"  (1.727 m)  Wt 90.7 kg   SpO2 96%   BMI 30.40 kg/m   Physical Exam Vitals reviewed.  Constitutional:      General: He is in acute distress.     Appearance: Normal appearance. He is ill-appearing. He is not toxic-appearing or diaphoretic.  HENT:     Head: Normocephalic and atraumatic.  Eyes:     General:        Right eye: No discharge.        Left eye: No discharge.     Extraocular Movements: Extraocular movements intact.     Conjunctiva/sclera: Conjunctivae normal.  Cardiovascular:     Rate and Rhythm: Tachycardia present. Rhythm irregular.     Pulses: Normal pulses.     Heart sounds: Normal heart sounds.   Pulmonary:     Comments: Extremely tachypneic with a rate of 40bpm, extremely rhonchorous labored breathing, patient unable to answer questions due to respiratory distress Abdominal:     Tenderness: There is no abdominal tenderness.  Musculoskeletal:        General: No swelling. Normal range of motion.  Skin:    General: Skin is warm.  Neurological:     General: No focal deficit present.     Mental Status: He is alert and oriented to person, place, and time.     Sensory: No sensory deficit.     Motor: No weakness.  Psychiatric:        Mood and Affect: Mood normal.        Behavior: Behavior normal.    ED Results / Procedures / Treatments   Labs (all labs ordered are listed, but only abnormal results are displayed) Labs Reviewed  BLOOD GAS, ARTERIAL - Abnormal; Notable for the following components:      Result Value   pH, Arterial 7.268 (*)    Acid-base deficit 6.0 (*)    All other components within normal limits    EKG None  Radiology No results found.  Procedures .Critical Care  Date/Time: 08/28/20 11:09 AM Performed by: Garald Balding, PA-C Authorized by: Garald Balding, PA-C   Critical care provider statement:    Critical care time (minutes):  35   Critical care was necessary to treat or prevent imminent or life-threatening deterioration of the following conditions:  Respiratory failure   Critical care was time spent personally by me on the following activities:  Discussions with consultants, evaluation of patient's response to treatment, examination of patient, ordering and performing treatments and interventions, ordering and review of laboratory studies, ordering and review of radiographic studies, pulse oximetry, re-evaluation of patient's condition, obtaining history from patient or surrogate and review of old charts   Medications Ordered in ED Medications  morphine 4 MG/ML injection 4 mg (4 mg Intravenous Given August 28, 2020 1056)    ED Course  I have  reviewed the triage vital signs and the nursing notes.  Pertinent labs & imaging results that were available during my care of the patient were reviewed by me and considered in my medical decision making (see chart for details).     MDM Rules/Calculators/A&P                          87 year old male presenting with respiratory distress.  DNR/DNI.  He was found to be tachycardic with a rate in the 170s, with new onset A. fib.  No prior history, no anticoagulation.  Patient with extremely labored breathing with significant rhonchi and wheezes.  Originally placed  on CPAP by EMS, however became altered and was taken off CPAP and placed on a nonrebreather at 10 L.  On arrival, we did place the patient back on CPAP.  Code sepsis was initiated and initially had plans to start on broad spectrum antibiotics..  I was able to reach the patient's medical decision-maker power of attorney Ricky Nguyen, who expressed her desires to discontinue CPAP treatment and place comfort care.  She reports that hospice was planning on coming to the facility today to evaluate him and placing orders.  I confirmed with the daughter to place the patient on comfort care.  CPAP removed, all other orders discontinued.  Provided morphine for pain.  I explained to the patient that he will be placed on comfort care and he is agreeable to this.  Patient expired at 12:01 pm. Family notified.   This was a shared visit with my supervising physician Dr. Rogene Houston who independently saw and evaluated the patient & provided guidance in evaluation/management/disposition ,in agreement with care   Final Clinical Impression(s) / ED Diagnoses Final diagnoses:  Acute respiratory failure with hypoxia ([place])  Comfort measures only status    Rx / DC Orders ED Discharge Orders     None        Lyndel Safe 09-06-2020 1206    Fredia Sorrow, MD 08/27/20 [phone]

## 2020-09-14 DEATH — deceased
